# Patient Record
Sex: Female | Born: 1985 | Race: White | Hispanic: No | Marital: Single | Smoking: Current every day smoker
Health system: Southern US, Community
[De-identification: ages and names within clinical notes are randomized; demographics above are authoritative.]

## PROBLEM LIST (undated history)

## (undated) DIAGNOSIS — Z789 Other specified health status: Secondary | ICD-10-CM

## (undated) DIAGNOSIS — R51 Headache: Secondary | ICD-10-CM

## (undated) HISTORY — PX: NO PAST SURGERIES: SHX2092

## (undated) HISTORY — PX: WISDOM TOOTH EXTRACTION: SHX21

## (undated) HISTORY — DX: Headache: R51

---
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
## 2008-01-26 ENCOUNTER — Emergency Department (HOSPITAL_COMMUNITY): Admission: EM | Admit: 2008-01-26 | Discharge: 2008-01-26 | Payer: Self-pay | Admitting: Specialist

## 2009-12-25 ENCOUNTER — Emergency Department (HOSPITAL_COMMUNITY): Admission: EM | Admit: 2009-12-25 | Discharge: 2009-12-26 | Payer: Self-pay | Admitting: Emergency Medicine

## 2010-07-17 ENCOUNTER — Emergency Department (HOSPITAL_COMMUNITY)
Admission: EM | Admit: 2010-07-17 | Discharge: 2010-07-17 | Payer: Self-pay | Source: Home / Self Care | Admitting: Emergency Medicine

## 2010-07-19 ENCOUNTER — Emergency Department (HOSPITAL_COMMUNITY)
Admission: EM | Admit: 2010-07-19 | Discharge: 2010-07-19 | Payer: Self-pay | Source: Home / Self Care | Admitting: Family Medicine

## 2010-12-20 NOTE — Procedures (Signed)
CHIEF COMPLAINT:  [age]-year-old female with episode of loss of  consciousness with generalized shaking that lasted for a few seconds to a  minute. The patient was well in the aftermath. EEG was ordered for possible  seizure. Study is being done to look for the presence of seizures.   PROCEDURE:  The tracing is carried out on a 32-channel digital Cadwell  recorder reformatted into 16-channel montages with one devoted to EKG. The  patient was awake and drowsy during the recording. The International 10/20  system lead placement was used.   DESCRIPTION OF FINDINGS:  Dominant frequency is a 9 Hz 15 microvolt activity  that is well regulated and attenuates very little with eye opening.   Background activity is a mixture of theta and occasional delta range  components that are  more generalized than centrally predominant and  posterior.   Frontally predominant under 10 microvolt beta range activity was seen.   Intermittent photic stimulation induced a sustained driving response at 5 Hz  and gave the suggestion of a sharply contoured slow wave at 7 Hz. This was  repeated again and showed the same very active well-defined driving response  at 5 Hz but did not show significant change at 7 Hz. For some reasons that  are unclear to me the technologist did not go farther. Occasional sharply  contoured slow waves were seen throughout the record, however, these were  not sufficient in number nor definitive in there feel to be epileptogenic  from an electrographic viewpoint.   There was no focal slowing. EKG showed a regular sinus rhythm with  ventricular response of 102 beats per minute.   IMPRESSION:  Abnormal EEG on the basis of mild diffuse background slowing.  The degree of theta and delta range activity is excessive and they relate to  a drowsy patient or perhaps a postictal state. There was no sign of cardiac  arrhythmia nor of seizure activity in this record.      DGL:OVFI  D:   09/09/2004 18:30:41  T:  09/09/2004 19:39:37  Job #:  [id number]   cc:   Nilda Simmer, M.D.  [address]  [place] [address]  Fax: [phone]
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
## 2011-05-28 ENCOUNTER — Inpatient Hospital Stay (INDEPENDENT_AMBULATORY_CARE_PROVIDER_SITE_OTHER)
Admission: RE | Admit: 2011-05-28 | Discharge: 2011-05-28 | Disposition: A | Discharge status: Home / Self Care | Payer: 59 | Source: Ambulatory Visit | Attending: Emergency Medicine | Admitting: Emergency Medicine

## 2011-05-28 DIAGNOSIS — J019 Acute sinusitis, unspecified: Secondary | ICD-10-CM

## 2011-05-28 DIAGNOSIS — J40 Bronchitis, not specified as acute or chronic: Secondary | ICD-10-CM

## 2011-07-29 ENCOUNTER — Encounter: Payer: Self-pay | Admitting: Emergency Medicine

## 2011-07-29 ENCOUNTER — Observation Stay (HOSPITAL_COMMUNITY)
Admission: EM | Admit: 2011-07-29 | Discharge: 2011-07-31 | Disposition: A | Discharge status: Home / Self Care | Payer: 59 | Attending: Internal Medicine | Admitting: Internal Medicine

## 2011-07-29 DIAGNOSIS — R824 Acetonuria: Secondary | ICD-10-CM | POA: Insufficient documentation

## 2011-07-29 DIAGNOSIS — R110 Nausea: Secondary | ICD-10-CM

## 2011-07-29 DIAGNOSIS — R1115 Cyclical vomiting syndrome unrelated to migraine: Principal | ICD-10-CM | POA: Insufficient documentation

## 2011-07-29 DIAGNOSIS — R1110 Vomiting, unspecified: Secondary | ICD-10-CM | POA: Diagnosis present

## 2011-07-29 DIAGNOSIS — K5900 Constipation, unspecified: Secondary | ICD-10-CM | POA: Insufficient documentation

## 2011-07-29 DIAGNOSIS — E860 Dehydration: Secondary | ICD-10-CM | POA: Insufficient documentation

## 2011-07-29 HISTORY — DX: Other specified health status: Z78.9

## 2011-07-29 NOTE — ED Notes (Signed)
Pt presented to the ER with c/o nausea and vomiting, states that s/s started today, pt attempted to eat and there after experinced nausea, went to sleep, had something to eat again, vomited there after. Pt reports multiple episodes of emesis, more then 10.

## 2011-07-30 ENCOUNTER — Encounter (HOSPITAL_COMMUNITY): Payer: Self-pay

## 2011-07-30 DIAGNOSIS — R1110 Vomiting, unspecified: Secondary | ICD-10-CM | POA: Diagnosis present

## 2011-07-30 DIAGNOSIS — E860 Dehydration: Secondary | ICD-10-CM | POA: Diagnosis present

## 2011-07-30 LAB — BASIC METABOLIC PANEL
Glucose, Bld: 140 mg/dL — ABNORMAL HIGH (ref 70–99)
Potassium: 3.8 mEq/L (ref 3.5–5.1)
Sodium: 137 mEq/L (ref 135–145)

## 2011-07-30 LAB — CBC
HCT: 39.5 % (ref 36.0–46.0)
Hemoglobin: 13.8 g/dL (ref 12.0–15.0)
RBC: 4.53 MIL/uL (ref 3.87–5.11)
WBC: 10.9 10*3/uL — ABNORMAL HIGH (ref 4.0–10.5)

## 2011-07-30 LAB — DIFFERENTIAL
Eosinophils Absolute: 0 10*3/uL (ref 0.0–0.7)
Eosinophils Relative: 0 % (ref 0–5)
Lymphs Abs: 0.9 10*3/uL (ref 0.7–4.0)
Monocytes Absolute: 0.2 10*3/uL (ref 0.1–1.0)

## 2011-07-30 LAB — URINALYSIS, ROUTINE W REFLEX MICROSCOPIC
Glucose, UA: NEGATIVE mg/dL
Protein, ur: 30 mg/dL — AB
Urobilinogen, UA: 0.2 mg/dL (ref 0.0–1.0)
pH: 8 (ref 5.0–8.0)

## 2011-07-30 LAB — PREGNANCY, URINE: Preg Test, Ur: NEGATIVE

## 2011-07-30 MED ORDER — ONDANSETRON HCL 4 MG/2ML IJ SOLN: 4.0000 mg | Freq: Once | INTRAMUSCULAR | Status: DC

## 2011-07-30 MED ORDER — PROMETHAZINE HCL 25 MG PO TABS: 25.0000 mg | ORAL_TABLET | Freq: Four times a day (QID) | ORAL | Status: DC | PRN

## 2011-07-30 MED ORDER — ONDANSETRON HCL 4 MG/2ML IJ SOLN
4.0000 mg | Freq: Once | INTRAMUSCULAR | Status: AC
  Administered 2011-07-30: 4 mg via INTRAVENOUS

## 2011-07-30 MED ORDER — SODIUM CHLORIDE 0.9 % IV BOLUS (SEPSIS)
1000.0000 mL | Freq: Once | INTRAVENOUS | Status: AC
  Administered 2011-07-30: 1000 mL via INTRAVENOUS

## 2011-07-30 MED ORDER — METOCLOPRAMIDE HCL 5 MG/ML IJ SOLN
10.0000 mg | Freq: Once | INTRAMUSCULAR | Status: AC
  Administered 2011-07-30: 10 mg via INTRAVENOUS
  Filled 2011-07-30: qty 2

## 2011-07-30 MED ORDER — ONDANSETRON HCL 4 MG/2ML IJ SOLN
4.0000 mg | Freq: Once | INTRAMUSCULAR | Status: AC
Start: 2011-07-30 — End: 2011-07-30
  Administered 2011-07-30: 4 mg via INTRAVENOUS
  Filled 2011-07-30: qty 2

## 2011-07-30 MED ORDER — SODIUM CHLORIDE 0.9 % IV SOLN
Freq: Once | INTRAVENOUS | Status: AC
  Administered 2011-07-30: 02:00:00 via INTRAVENOUS

## 2011-07-30 MED ORDER — KETOROLAC TROMETHAMINE 30 MG/ML IJ SOLN
30.0000 mg | Freq: Once | INTRAMUSCULAR | Status: AC
  Administered 2011-07-30: 30 mg via INTRAVENOUS
  Filled 2011-07-30: qty 1

## 2011-07-30 MED ORDER — DROPERIDOL 2.5 MG/ML IJ SOLN
1.2500 mg | Freq: Once | INTRAMUSCULAR | Status: AC
  Administered 2011-07-30: 1.25 mg via INTRAVENOUS
  Filled 2011-07-30: qty 0.5

## 2011-07-30 MED ORDER — PROMETHAZINE HCL 25 MG/ML IJ SOLN
12.5000 mg | Freq: Once | INTRAMUSCULAR | Status: AC
  Administered 2011-07-30: 12.5 mg via INTRAVENOUS
  Filled 2011-07-30: qty 1

## 2011-07-30 MED ORDER — METOCLOPRAMIDE HCL 5 MG/ML IJ SOLN: 10.0000 mg | Freq: Once | INTRAMUSCULAR | Status: DC

## 2011-07-30 MED ORDER — ACETAMINOPHEN 650 MG RE SUPP
650.0000 mg | Freq: Once | RECTAL | Status: AC
  Administered 2011-07-30: 650 mg via RECTAL
  Filled 2011-07-30: qty 1

## 2011-07-30 MED ORDER — DIPHENHYDRAMINE HCL 50 MG/ML IJ SOLN
25.0000 mg | Freq: Once | INTRAMUSCULAR | Status: AC
  Administered 2011-07-30: 25 mg via INTRAVENOUS

## 2011-07-30 MED ORDER — ONDANSETRON HCL 4 MG/2ML IJ SOLN
4.0000 mg | Freq: Once | INTRAMUSCULAR | Status: DC
  Filled 2011-07-30: qty 2

## 2011-07-30 MED ORDER — ONDANSETRON HCL 4 MG/2ML IJ SOLN
INTRAMUSCULAR | Status: AC
  Administered 2011-07-31: 01:00:00
  Filled 2011-07-30: qty 2

## 2011-07-30 MED ORDER — SODIUM CHLORIDE 0.9 % IV SOLN
Freq: Once | INTRAVENOUS | Status: AC
  Administered 2011-07-30: 16:00:00 via INTRAVENOUS

## 2011-07-30 MED ORDER — KCL IN DEXTROSE-NACL 20-5-0.45 MEQ/L-%-% IV SOLN
INTRAVENOUS | Status: DC
  Administered 2011-07-30 – 2011-07-31 (×2): via INTRAVENOUS
  Filled 2011-07-30 (×6): qty 1000

## 2011-07-30 NOTE — ED Notes (Signed)
Attempted report Rn will return call

## 2011-07-30 NOTE — ED Provider Notes (Addendum)
Assumed care from Dr. Preston Fleeting.  Reviewed H&P and reevaluated the patient.  I agree with his assessment.  She continues to have nausea.  I will give her Phenergan now and another liter of fluids. The patient has not had any emesis in the emergency department.  I will write her a prescription for an antiemetic and release her to home.  Nicholes Stairs, MD 07/30/11 1106  12:27 PM  The rn told me the pt vomited once again.  Will give inapsine.  Nicholes Stairs, MD 07/30/11 1227  2:40 PM Still nauseated even after droperidol Will cancel discharge and admit  2:58 PM Spoke with triad md.  He will come admit for refractory n/v with ketonuria  Nicholes Stairs, MD 07/30/11 1459

## 2011-07-30 NOTE — ED Notes (Signed)
Patient asleep assessment deferred.

## 2011-07-30 NOTE — H&P (Signed)
PCP:   No primary provider on file.   Chief Complaint:  irretractable vomiting  HPI: This is a 25 year old healthy female presents to the emergency department with approximately 24 hours of irretractable vomiting and dehydration that began after eating ay a [place]. Vomiting made worse with food. Nothing has improved the vomiting. She denies diarrhea, abdominal pain. She does have sweats and chills. No sick contacts. Her friends who is at [employer] with her have similar symptoms, although eating similar foods. Last bowel movement yesterday which was normal per the patient.  Review of Systems:  The patient denies anorexia, weight loss,, vision loss, decreased hearing, hoarseness, chest pain, syncope, dyspnea on exertion, peripheral edema, balance deficits, hemoptysis, abdominal pain, melena, hematochezia, severe indigestion/heartburn, hematuria, incontinence, genital sores, muscle weakness, suspicious skin lesions, transient blindness, difficulty walking, depression, unusual weight change, abnormal bleeding, enlarged lymph nodes, angioedema, and breast masses.  Past Medical History: Past Medical History  Diagnosis Date  . No pertinent past medical history    Past Surgical History  Procedure Date  . No past surgeries     Medications: Prior to Admission medications   Medication Sig Start Date End Date Taking? Authorizing Provider  promethazine (PHENERGAN) 25 MG tablet Take 1 tablet (25 mg total) by mouth every 6 (six) hours as needed for nausea. 07/30/11 08/06/11  Nicholes Stairs, MD    Allergies:  No Known Allergies  Social History:  reports that she has never smoked. She has never used smokeless tobacco. She reports that she does not drink alcohol or use illicit drugs.  Family History: History reviewed. No pertinent family history.  Physical Exam: Filed Vitals:   07/30/11 0501 07/30/11 0743 07/30/11 1113 07/30/11 1316  BP: 116/82 119/66 114/77   Pulse: 102  95 90 78  Temp: 98.1 F (36.7 C) 99.4 F (37.4 C) 98 F (36.7 C)   TempSrc: Oral Oral Oral   Resp: 16 16 18 16   SpO2: 100% 100% 100% 100%   General appearance: alert, cooperative and no distress Head: Normocephalic, without obvious abnormality, atraumatic Eyes: conjunctivae/corneas clear. PERRL, EOM's intact. Fundi benign. Ears: normal TM's and external ear canals both ears Throat: lips, mucosa, and tongue normal; teeth and gums normal Neck: no adenopathy, no carotid bruit, no JVD, supple, symmetrical, trachea midline and thyroid not enlarged, symmetric, no tenderness/mass/nodules Resp: clear to auscultation bilaterally Cardio: regular rate and rhythm, S1, S2 normal, no murmur, click, rub or gallop GI: Mild left lower quadrant tenderness. Extremities: extremities normal, atraumatic, no cyanosis or edema Pulses: 2+ and symmetric Skin: Skin color, texture, turgor normal. No rashes or lesions Lymph nodes: Cervical, supraclavicular, and axillary nodes normal.   Labs on Admission:   [facility] 07/30/11 0117  NA 137  K 3.8  CL 102  CO2 24  GLUCOSE 140*  BUN 11  CREATININE 0.61  CALCIUM 9.7  MG --  PHOS --   Basename 07/30/11 0117  WBC 10.9*  NEUTROABS 9.7*  HGB 13.8  HCT 39.5  MCV 87.2  PLT 216   Urinalysis showed constipated urine with greater than 80 ketones with negative nitrites and leukocyte esterase.  Assessment/Plan #1 irretractable vomiting likely due to viral gastroenteritis. #2 dehydration secondary to #1 #3 ketonuria  Admit for observation. Will start D5 half normal saline with potassium of IV fluids to 2 dehydration and ketonuria. Continue antiemetics. Encourage sips of fluids. SCDs for DVT prophylaxis. Patient is a full code.  Dana Debo JEHIEL 07/30/2011, 3:57 PM

## 2011-07-30 NOTE — ED Notes (Signed)
Report called to the floor patient tfrd to floor via bed

## 2011-07-30 NOTE — ED Provider Notes (Addendum)
History     CSN: [id number]  Arrival date & time 07/29/11  2330   First MD Initiated Contact with Patient 07/30/11 0056      Chief Complaint  Patient presents with  . Nausea  . Emesis    (Consider location/radiation/quality/duration/timing/severity/associated sxs/prior treatment) The history is provided by the patient.   25 year old female had onset at about 2 PM of nausea and vomiting. There's been some mild, crampy abdominal pain associated with this. She denies fever but states she has had chills and sweats. She denies diarrhea. She denies any urinary symptoms. Symptoms are severe and she's not been able to hold anything down since she started getting sick. She denies sick contacts and no one else at home has gotten ill. History reviewed. No pertinent past medical history.  History reviewed. No pertinent past surgical history.  History reviewed. No pertinent family history.  History  Substance Use Topics  . Smoking status: Never Smoker   . Smokeless tobacco: Not on file  . Alcohol Use: No    OB History    Grav Para Term Preterm Abortions TAB SAB Ect Mult Living                  Review of Systems  All other systems reviewed and are negative.    Allergies  Review of patient's allergies indicates no known allergies.  Home Medications  No current outpatient prescriptions on file.  BP 100/67  Pulse 87  Temp(Src) 97.6 F (36.4 C) (Oral)  Resp 16  SpO2 100%  LMP 07/15/2011  Physical Exam  Nursing note and vitals reviewed.  25-year-old female who is uncomfortable. Vital signs are normal. Oxygen saturation is 100% which is normal. Head is normocephalic and atraumatic. PERRLA, EOMI. There is no scleral icterus. Mucous members are moist. Neck is supple without adenopathy lungs are clear without rales, wheezes, rhonchi. Back is nontender. Heart has regular rate and rhythm without murmur. Abdomen is soft, flat, nontender without masses or hepatosplenomegaly.  Peristalsis is diminished. Extremities have no cyanosis or edema skin is warm and moist without rash. Neurologic: Mental status is normal, cranial nerves are intact, there are no focal motor or sensory deficits. Psychiatric: No abnormalities of mood or affect. ED Course  Procedures (including critical care time)   Labs Reviewed  CBC  DIFFERENTIAL  BASIC METABOLIC PANEL  URINALYSIS, ROUTINE W REFLEX MICROSCOPIC  POCT PREGNANCY, URINE   No results found. Results for orders placed during the hospital encounter of 07/29/11  CBC      Component Value Range   WBC 10.9 (*) 4.0 - 10.5 (K/uL)   RBC 4.53  3.87 - 5.11 (MIL/uL)   Hemoglobin 13.8  12.0 - 15.0 (g/dL)   HCT 43.3  29.5 - 18.8 (%)   MCV 87.2  78.0 - 100.0 (fL)   MCH 30.5  26.0 - 34.0 (pg)   MCHC 34.9  30.0 - 36.0 (g/dL)   RDW 41.6  60.6 - 30.1 (%)   Platelets 216  150 - 400 (K/uL)  DIFFERENTIAL      Component Value Range   Neutrophils Relative 89 (*) 43 - 77 (%)   Neutro Abs 9.7 (*) 1.7 - 7.7 (K/uL)   Lymphocytes Relative 9 (*) 12 - 46 (%)   Lymphs Abs 0.9  0.7 - 4.0 (K/uL)   Monocytes Relative 2 (*) 3 - 12 (%)   Monocytes Absolute 0.2  0.1 - 1.0 (K/uL)   Eosinophils Relative 0  0 - 5 (%)  Eosinophils Absolute 0.0  0.0 - 0.7 (K/uL)   Basophils Relative 0  0 - 1 (%)   Basophils Absolute 0.0  0.0 - 0.1 (K/uL)  BASIC METABOLIC PANEL      Component Value Range   Sodium 137  135 - 145 (mEq/L)   Potassium 3.8  3.5 - 5.1 (mEq/L)   Chloride 102  96 - 112 (mEq/L)   CO2 24  19 - 32 (mEq/L)   Glucose, Bld 140 (*) 70 - 99 (mg/dL)   BUN 11  6 - 23 (mg/dL)   Creatinine, Ser 1.61  0.50 - 1.10 (mg/dL)   Calcium 9.7  8.4 - 09.6 (mg/dL)   GFR calc non Af Amer >90  >90 (mL/min)   GFR calc Af Amer >90  >90 (mL/min)  URINALYSIS, ROUTINE W REFLEX MICROSCOPIC      Component Value Range   Color, Urine YELLOW  YELLOW    APPearance TURBID (*) CLEAR    Specific Gravity, Urine 1.026  1.005 - 1.030    pH 8.0  5.0 - 8.0    Glucose, UA  NEGATIVE  NEGATIVE (mg/dL)   Hgb urine dipstick NEGATIVE  NEGATIVE    Bilirubin Urine NEGATIVE  NEGATIVE    Ketones, ur >80 (*) NEGATIVE (mg/dL)   Protein, ur 30 (*) NEGATIVE (mg/dL)   Urobilinogen, UA 0.2  0.0 - 1.0 (mg/dL)   Nitrite NEGATIVE  NEGATIVE    Leukocytes, UA SMALL (*) NEGATIVE   PREGNANCY, URINE      Component Value Range   Preg Test, Ur NEGATIVE    URINE MICROSCOPIC-ADD ON      Component Value Range   Squamous Epithelial / LPF FEW (*) RARE    WBC, UA 3-6  <3 (WBC/hpf)   Urine-Other AMORPHOUS URATES/PHOSPHATES     No results found.  Results for orders placed during the hospital encounter of 07/29/11  CBC      Component Value Range   WBC 10.9 (*) 4.0 - 10.5 (K/uL)   RBC 4.53  3.87 - 5.11 (MIL/uL)   Hemoglobin 13.8  12.0 - 15.0 (g/dL)   HCT 04.5  40.9 - 81.1 (%)   MCV 87.2  78.0 - 100.0 (fL)   MCH 30.5  26.0 - 34.0 (pg)   MCHC 34.9  30.0 - 36.0 (g/dL)   RDW 91.4  78.2 - 95.6 (%)   Platelets 216  150 - 400 (K/uL)  DIFFERENTIAL      Component Value Range   Neutrophils Relative 89 (*) 43 - 77 (%)   Neutro Abs 9.7 (*) 1.7 - 7.7 (K/uL)   Lymphocytes Relative 9 (*) 12 - 46 (%)   Lymphs Abs 0.9  0.7 - 4.0 (K/uL)   Monocytes Relative 2 (*) 3 - 12 (%)   Monocytes Absolute 0.2  0.1 - 1.0 (K/uL)   Eosinophils Relative 0  0 - 5 (%)   Eosinophils Absolute 0.0  0.0 - 0.7 (K/uL)   Basophils Relative 0  0 - 1 (%)   Basophils Absolute 0.0  0.0 - 0.1 (K/uL)  BASIC METABOLIC PANEL      Component Value Range   Sodium 137  135 - 145 (mEq/L)   Potassium 3.8  3.5 - 5.1 (mEq/L)   Chloride 102  96 - 112 (mEq/L)   CO2 24  19 - 32 (mEq/L)   Glucose, Bld 140 (*) 70 - 99 (mg/dL)   BUN 11  6 - 23 (mg/dL)   Creatinine, Ser 2.13  0.50 - 1.10 (  mg/dL)   Calcium 9.7  8.4 - 16.1 (mg/dL)   GFR calc non Af Amer >90  >90 (mL/min)   GFR calc Af Amer >90  >90 (mL/min)  URINALYSIS, ROUTINE W REFLEX MICROSCOPIC      Component Value Range   Color, Urine YELLOW  YELLOW    APPearance TURBID  (*) CLEAR    Specific Gravity, Urine 1.026  1.005 - 1.030    pH 8.0  5.0 - 8.0    Glucose, UA NEGATIVE  NEGATIVE (mg/dL)   Hgb urine dipstick NEGATIVE  NEGATIVE    Bilirubin Urine NEGATIVE  NEGATIVE    Ketones, ur >80 (*) NEGATIVE (mg/dL)   Protein, ur 30 (*) NEGATIVE (mg/dL)   Urobilinogen, UA 0.2  0.0 - 1.0 (mg/dL)   Nitrite NEGATIVE  NEGATIVE    Leukocytes, UA SMALL (*) NEGATIVE   PREGNANCY, URINE      Component Value Range   Preg Test, Ur NEGATIVE    URINE MICROSCOPIC-ADD ON      Component Value Range   Squamous Epithelial / LPF FEW (*) RARE    WBC, UA 3-6  <3 (WBC/hpf)   Urine-Other AMORPHOUS URATES/PHOSPHATES    BASIC METABOLIC PANEL      Component Value Range   Sodium 136  135 - 145 (mEq/L)   Potassium 3.8  3.5 - 5.1 (mEq/L)   Chloride 106  96 - 112 (mEq/L)   CO2 22  19 - 32 (mEq/L)   Glucose, Bld 136 (*) 70 - 99 (mg/dL)   BUN 6  6 - 23 (mg/dL)   Creatinine, Ser 0.96  0.50 - 1.10 (mg/dL)   Calcium 8.6  8.4 - 04.5 (mg/dL)   GFR calc non Af Amer >90  >90 (mL/min)   GFR calc Af Amer >90  >90 (mL/min)   No results found.   No diagnosis found.  She was given IV fluids and IV Zofran, but states that she is not feeling any better. Urine is significant only for ketonuria. She was given additional fluid and she will get a trial of Reglan to see if that gives her better control of nausea.  She is endorsed to Dr. Loma Sender to evaluate her response to Reglan and IV fluid. MDM  Nausea and vomiting-most likely viral gastritis.        Dione Booze, MD 07/31/11 0800  Dione Booze, MD 07/31/11 [phone]

## 2011-07-30 NOTE — ED Notes (Signed)
patient now awake and assessment done

## 2011-07-31 ENCOUNTER — Encounter (HOSPITAL_COMMUNITY): Payer: Self-pay | Admitting: *Deleted

## 2011-07-31 DIAGNOSIS — K5900 Constipation, unspecified: Secondary | ICD-10-CM | POA: Diagnosis present

## 2011-07-31 LAB — BASIC METABOLIC PANEL
BUN: 6 mg/dL (ref 6–23)
CO2: 22 mEq/L (ref 19–32)
Chloride: 106 mEq/L (ref 96–112)
GFR calc Af Amer: >90 mL/min (ref >90)
Potassium: 3.8 mEq/L (ref 3.5–5.1)

## 2011-07-31 MED ORDER — ZOLPIDEM TARTRATE 5 MG PO TABS
5.0000 mg | ORAL_TABLET | Freq: Every evening | ORAL | Status: DC | PRN
  Administered 2011-07-31: 5 mg via ORAL
  Filled 2011-07-31: qty 1

## 2011-07-31 MED ORDER — PROMETHAZINE HCL 25 MG PO TABS: 12.5000 mg | ORAL_TABLET | Freq: Four times a day (QID) | ORAL | Status: AC | PRN

## 2011-07-31 MED ORDER — ONDANSETRON 4 MG PO TBDP
4.0000 mg | ORAL_TABLET | ORAL | Status: DC | PRN
  Filled 2011-07-31: qty 1

## 2011-07-31 MED ORDER — INFLUENZA VIRUS VACC SPLIT PF IM SUSP
0.5000 mL | Freq: Once | INTRAMUSCULAR | Status: AC
  Administered 2011-07-31: 0.5 mL via INTRAMUSCULAR
  Filled 2011-07-31: qty 0.5

## 2011-07-31 MED ORDER — PROMETHAZINE HCL 25 MG PO TABS: 25.0000 mg | ORAL_TABLET | Freq: Four times a day (QID) | ORAL | Status: DC | PRN

## 2011-07-31 MED ORDER — BISACODYL 10 MG RE SUPP: 10.0000 mg | Freq: Every day | RECTAL | Status: AC | PRN

## 2011-07-31 MED ORDER — PROMETHAZINE HCL 25 MG/ML IJ SOLN: 25.0000 mg | Freq: Four times a day (QID) | INTRAMUSCULAR | Status: DC | PRN

## 2011-07-31 MED ORDER — BISACODYL 10 MG RE SUPP
10.0000 mg | Freq: Every day | RECTAL | Status: DC | PRN
  Administered 2011-07-31: 10 mg via RECTAL
  Filled 2011-07-31: qty 1

## 2011-07-31 MED ORDER — SENNA-DOCUSATE SODIUM 8.6-50 MG PO TABS: 1.0000 | ORAL_TABLET | Freq: Two times a day (BID) | ORAL | Status: DC | PRN

## 2011-07-31 MED ORDER — ONDANSETRON HCL 4 MG/2ML IJ SOLN
4.0000 mg | INTRAMUSCULAR | Status: DC | PRN
  Administered 2011-07-31 (×2): 4 mg via INTRAVENOUS
  Filled 2011-07-31 (×2): qty 2

## 2011-07-31 NOTE — Progress Notes (Signed)
Pt discharged home with mother in stable condition. Discharge instructions and scripts given. Pt verbalized understanding. 

## 2011-07-31 NOTE — Discharge Summary (Signed)
Discharge Summary  [name] MR#: [id number]  DOB:1986-04-14  Date of Admission: 07/29/2011 Date of Discharge: 07/31/2011  Patient's PCP: No primary provider on file.  Attending Physician:Kallum Jorgensen A  Consults: None  Discharge Diagnoses:  Vomiting likely due to viral gastroenteritis versus possible food poisoning.  Dehydration, moderate  Constipation  Brief Admitting History and Physical 25 year old Caucasian female with no significant past medical history presented on 07/30/2011 with intractable nausea and vomiting after ED and at [employer].  Discharge Medications Current Discharge Medication List    START taking these medications   Details  bisacodyl (DULCOLAX) 10 MG suppository Place 1 suppository (10 mg total) rectally daily as needed for constipation. Qty: 5 suppository, Refills: 0    promethazine (PHENERGAN) 25 MG tablet Take 0.5 tablets (12.5 mg total) by mouth every 6 (six) hours as needed for nausea. Qty: 15 tablet, Refills: 0    sennosides-docusate sodium (SENOKOT-S) 8.6-50 MG tablet Take 1 tablet by mouth 2 (two) times daily as needed for constipation. Hold for diarrhea. Qty: 30 tablet, Refills: 0        Hospital Course: 1. Irretractable vomiting likely due to viral gastroenteritis versus possible food poisoning.  Improved with conservative management with IV hydration and antiemetics.  Patient was started on clear liquid diet and diet was advanced as tolerated to general diet.  Prior to discharge patient was eating solid food and was able to keep it down.  2. Dehydration secondary to #1, resolved with IV hydration.   3. Ketonuria.  Likely due to dehydration.  4. Constipation. Started the patient on stool softeners and suppository, which will be continued at discharge.  Day of Discharge BP 117/64  Pulse 58  Temp(Src) 98 F (36.7 C) (Oral)  Resp 18  Ht 5\' 1"  (1.549 m)  Wt 47.401 kg (104 lb 8 oz)  BMI 19.75 kg/m2  SpO2 100%   LMP 07/15/2011  Results for orders placed during the hospital encounter of 07/29/11 (from the past 48 hour(s))  CBC     Status: Abnormal   Collection Time   07/30/11  1:17 AM      Component Value Range Comment   WBC 10.9 (*) 4.0 - 10.5 (K/uL)    RBC 4.53  3.87 - 5.11 (MIL/uL)    Hemoglobin 13.8  12.0 - 15.0 (g/dL)    HCT 40.9  81.1 - 91.4 (%)    MCV 87.2  78.0 - 100.0 (fL)    MCH 30.5  26.0 - 34.0 (pg)    MCHC 34.9  30.0 - 36.0 (g/dL)    RDW 78.2  95.6 - 21.3 (%)    Platelets 216  150 - 400 (K/uL)   DIFFERENTIAL     Status: Abnormal   Collection Time   07/30/11  1:17 AM      Component Value Range Comment   Neutrophils Relative 89 (*) 43 - 77 (%)    Neutro Abs 9.7 (*) 1.7 - 7.7 (K/uL)    Lymphocytes Relative 9 (*) 12 - 46 (%)    Lymphs Abs 0.9  0.7 - 4.0 (K/uL)    Monocytes Relative 2 (*) 3 - 12 (%)    Monocytes Absolute 0.2  0.1 - 1.0 (K/uL)    Eosinophils Relative 0  0 - 5 (%)    Eosinophils Absolute 0.0  0.0 - 0.7 (K/uL)    Basophils Relative 0  0 - 1 (%)    Basophils Absolute 0.0  0.0 - 0.1 (K/uL)   BASIC METABOLIC PANEL  Status: Abnormal   Collection Time   07/30/11  1:17 AM      Component Value Range Comment   Sodium 137  135 - 145 (mEq/L)    Potassium 3.8  3.5 - 5.1 (mEq/L)    Chloride 102  96 - 112 (mEq/L)    CO2 24  19 - 32 (mEq/L)    Glucose, Bld 140 (*) 70 - 99 (mg/dL)    BUN 11  6 - 23 (mg/dL)    Creatinine, Ser 2.59  0.50 - 1.10 (mg/dL)    Calcium 9.7  8.4 - 10.5 (mg/dL)    GFR calc non Af Amer >90  >90 (mL/min)    GFR calc Af Amer >90  >90 (mL/min)   URINALYSIS, ROUTINE W REFLEX MICROSCOPIC     Status: Abnormal   Collection Time   07/30/11  3:14 AM      Component Value Range Comment   Color, Urine YELLOW  YELLOW     APPearance TURBID (*) CLEAR     Specific Gravity, Urine 1.026  1.005 - 1.030     pH 8.0  5.0 - 8.0     Glucose, UA NEGATIVE  NEGATIVE (mg/dL)    Hgb urine dipstick NEGATIVE  NEGATIVE     Bilirubin Urine NEGATIVE  NEGATIVE     Ketones,  ur >80 (*) NEGATIVE (mg/dL)    Protein, ur 30 (*) NEGATIVE (mg/dL)    Urobilinogen, UA 0.2  0.0 - 1.0 (mg/dL)    Nitrite NEGATIVE  NEGATIVE     Leukocytes, UA SMALL (*) NEGATIVE    URINE MICROSCOPIC-ADD ON     Status: Abnormal   Collection Time   07/30/11  3:14 AM      Component Value Range Comment   Squamous Epithelial / LPF FEW (*) RARE     WBC, UA 3-6  <3 (WBC/hpf)    Urine-Other AMORPHOUS URATES/PHOSPHATES   MUCOUS PRESENT  PREGNANCY, URINE     Status: Normal   Collection Time   07/30/11  3:46 AM      Component Value Range Comment   Preg Test, Ur NEGATIVE     BASIC METABOLIC PANEL     Status: Abnormal   Collection Time   07/31/11  3:33 AM      Component Value Range Comment   Sodium 136  135 - 145 (mEq/L)    Potassium 3.8  3.5 - 5.1 (mEq/L)    Chloride 106  96 - 112 (mEq/L)    CO2 22  19 - 32 (mEq/L)    Glucose, Bld 136 (*) 70 - 99 (mg/dL)    BUN 6  6 - 23 (mg/dL)    Creatinine, Ser 5.63  0.50 - 1.10 (mg/dL)    Calcium 8.6  8.4 - 10.5 (mg/dL)    GFR calc non Af Amer >90  >90 (mL/min)    GFR calc Af Amer >90  >90 (mL/min)     No results found.   Disposition: Home  Diet: Resume as tolerated  Activity: Resume as tolerated   Follow-up Appts: Discharge Orders    Future Orders Please Complete By Expires   Diet general      Increase activity slowly         TESTS THAT NEED FOLLOW-UP None  Time spent on discharge, talking to the patient, and coordinating care: 25 mins.   Signed: Cristal Ford, MD 07/31/2011, 2:19 PM

## 2011-07-31 NOTE — Progress Notes (Signed)
Subjective: Feeling better today.  Nausea and vomiting controlled.  Able to tolerate a diet today.  Objective: Vital signs in last 24 hours: Filed Vitals:   07/30/11 2246 07/30/11 2300 07/31/11 0646 07/31/11 1312  BP:  108/74 96/62 117/64  Pulse:  84 75 58  Temp:  98.3 F (36.8 C) 97.7 F (36.5 C) 98 F (36.7 C)  TempSrc:  Oral Oral Oral  Resp:  18 16 18   Height: 5\' 1"  (1.549 m) 5\' 1"  (1.549 m)    Weight: 45.496 kg (100 lb 4.8 oz) 47.401 kg (104 lb 8 oz)    SpO2:  95% 98% 100%   Weight change:   Intake/Output Summary (Last 24 hours) at 07/31/11 1412 Last data filed at 07/31/11 1300  Gross per 24 hour  Intake   4120 ml  Output    450 ml  Net   3670 ml    Physical Exam: General: Awake, Oriented, No acute distress. HEENT: EOMI. Neck: Supple CV: S1 and S2 Lungs: Clear to ascultation bilaterally Abdomen: Soft, Nontender, Nondistended, +bowel sounds. Ext: Good pulses. Trace edema.  Lab Results:  [facility] 07/31/11 0333 07/30/11 0117  NA 136 137  K 3.8 3.8  CL 106 102  CO2 22 24  GLUCOSE 136* 140*  BUN 6 11  CREATININE 0.53 0.61  CALCIUM 8.6 9.7  MG -- --  PHOS -- --   No results found for this basename: AST:2,ALT:2,ALKPHOS:2,BILITOT:2,PROT:2,ALBUMIN:2 in the last 72 hours No results found for this basename: LIPASE:2,AMYLASE:2 in the last 72 hours  Basename 07/30/11 0117  WBC 10.9*  NEUTROABS 9.7*  HGB 13.8  HCT 39.5  MCV 87.2  PLT 216   No results found for this basename: CKTOTAL:3,CKMB:3,CKMBINDEX:3,TROPONINI:3 in the last 72 hours No components found with this basename: POCBNP:3 No results found for this basename: DDIMER:2 in the last 72 hours No results found for this basename: HGBA1C:2 in the last 72 hours No results found for this basename: CHOL:2,HDL:2,LDLCALC:2,TRIG:2,CHOLHDL:2,LDLDIRECT:2 in the last 72 hours No results found for this basename: TSH,T4TOTAL,FREET3,T3FREE,THYROIDAB in the last 72 hours No results found for this basename:  VITAMINB12:2,FOLATE:2,FERRITIN:2,TIBC:2,IRON:2,RETICCTPCT:2 in the last 72 hours  Micro Results: No results found for this or any previous visit (from the past 240 hour(s)).  Studies/Results: No results found.  Medications: I have reviewed the patient's current medications. Scheduled Meds:   . sodium chloride   Intravenous Once  . acetaminophen  650 mg Rectal Once  . influenza  inactive virus vaccine  0.5 mL Intramuscular Once  . ketorolac  30 mg Intravenous Once  . ondansetron      . ondansetron (ZOFRAN) IV  4 mg Intravenous Once  . sodium chloride  1,000 mL Intravenous Once   Continuous Infusions:   . dextrose 5 % and 0.45 % NaCl with KCl 20 mEq/L 125 mL/hr at 07/31/11 0700   PRN Meds:.bisacodyl, ondansetron (ZOFRAN) IV, ondansetron, promethazine, promethazine, zolpidem  Assessment/Plan: 1. Irretractable vomiting likely due to viral gastroenteritis versus possible food poisoning.  2. Dehydration secondary to #1, resolved with IV hydration. 3. Ketonuria  4. Constipation.  Started the patient on stool softeners and suppository.  Discharge patient home today.   LOS: 2 days  Penny Arrambide A, MD 07/31/2011, 2:12 PM

## 2011-12-08 ENCOUNTER — Encounter: Payer: Self-pay | Admitting: Internal Medicine

## 2011-12-08 ENCOUNTER — Telehealth: Payer: Self-pay

## 2011-12-08 ENCOUNTER — Ambulatory Visit (INDEPENDENT_AMBULATORY_CARE_PROVIDER_SITE_OTHER): Payer: No Typology Code available for payment source | Admitting: Internal Medicine

## 2011-12-08 ENCOUNTER — Ambulatory Visit: Payer: No Typology Code available for payment source

## 2011-12-08 VITALS — BP 110/76 | HR 106 | Temp 97.9°F | Resp 16 | Ht 62.0 in | Wt 92.0 lb

## 2011-12-08 DIAGNOSIS — S022XXA Fracture of nasal bones, initial encounter for closed fracture: Secondary | ICD-10-CM

## 2011-12-08 DIAGNOSIS — S0003XA Contusion of scalp, initial encounter: Secondary | ICD-10-CM

## 2011-12-08 DIAGNOSIS — S0033XA Contusion of nose, initial encounter: Secondary | ICD-10-CM

## 2011-12-08 MED ORDER — HYDROCODONE-ACETAMINOPHEN 5-500 MG PO TABS: 1.0000 | ORAL_TABLET | Freq: Four times a day (QID) | ORAL | Status: AC | PRN

## 2011-12-08 NOTE — Telephone Encounter (Signed)
Patient called again stating that she was in pain still and just would like a call back.
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
## 2011-12-09 MED ORDER — OXYCODONE-ACETAMINOPHEN 5-325 MG PO TABS: 1.0000 | ORAL_TABLET | Freq: Three times a day (TID) | ORAL | Status: AC | PRN

## 2011-12-09 NOTE — Telephone Encounter (Signed)
Spoke with patient she states that shes not getting any sleep and that it hurt when she move or breath and medicine isnt working got to take pain medicine every 30 min.

## 2011-12-09 NOTE — Telephone Encounter (Signed)
Addended by: Sondra Barges on: 12/09/2011 02:17 PM   Modules accepted: Orders

## 2011-12-09 NOTE — Telephone Encounter (Signed)
.  [facility] The patient called again to request a stronger pain medication for her fractured nose.  Patient states that she did not sleep at all last night due to the pain.  Please call patient at [phone].

## 2011-12-09 NOTE — Telephone Encounter (Signed)
[facility] notifying patient that rx is ready to pickup.

## 2011-12-09 NOTE — Telephone Encounter (Signed)
Done and printed

## 2012-02-16 ENCOUNTER — Encounter (HOSPITAL_COMMUNITY): Payer: Self-pay | Admitting: *Deleted

## 2012-02-16 ENCOUNTER — Emergency Department (HOSPITAL_COMMUNITY)
Admission: EM | Admit: 2012-02-16 | Discharge: 2012-02-16 | Disposition: A | Discharge status: Home / Self Care | Payer: Self-pay | Attending: Emergency Medicine | Admitting: Emergency Medicine

## 2012-02-16 DIAGNOSIS — K089 Disorder of teeth and supporting structures, unspecified: Secondary | ICD-10-CM | POA: Insufficient documentation

## 2012-02-16 DIAGNOSIS — K0889 Other specified disorders of teeth and supporting structures: Secondary | ICD-10-CM

## 2012-02-16 MED ORDER — OXYCODONE-ACETAMINOPHEN 5-325 MG PO TABS: 1.0000 | ORAL_TABLET | Freq: Four times a day (QID) | ORAL | Status: AC | PRN

## 2012-02-16 MED ORDER — PENICILLIN V POTASSIUM 500 MG PO TABS: 500.0000 mg | ORAL_TABLET | Freq: Four times a day (QID) | ORAL | Status: AC

## 2012-02-16 NOTE — ED Provider Notes (Signed)
Medical screening examination/treatment/procedure(s) were performed by non-physician practitioner and as supervising physician I was immediately available for consultation/collaboration.  Karnell Vanderloop L Maurine Mowbray, MD 02/16/12 2354 

## 2012-02-16 NOTE — ED Notes (Signed)
Pt presents with multiple decayed teeth, states "I've already had some pulled and trying to save money, I don't want to lose all my teeth"

## 2012-02-16 NOTE — ED Provider Notes (Signed)
History     CSN: [id number]  Arrival date & time 02/16/12  1638   First MD Initiated Contact with Patient 02/16/12 1734      Chief Complaint  Patient presents with  . Dental Pain    multiple decayed teeth    (Consider location/radiation/quality/duration/timing/severity/associated sxs/prior treatment) HPI Comments: Patient presenting with dental pain.  She does not have a dentist.  She has been taking Ibuprofen for the pain, which provides mild relief.  Patient is a 26 y.o. female presenting with tooth pain. The history is provided by the patient.  Dental PainThe primary symptoms include mouth pain. Primary symptoms do not include dental injury, oral bleeding, oral lesions, headaches, fever or sore throat. Episode onset: 2 weeks ago. The symptoms are worsening. The symptoms occur constantly.  Additional symptoms include: gum swelling, gum tenderness and jaw pain. Additional symptoms do not include: dental sensitivity to temperature, purulent gums, trismus, facial swelling, trouble swallowing, pain with swallowing, excessive salivation, taste disturbance, smell disturbance and drooling.    Past Medical History  Diagnosis Date  . No pertinent past medical history     Past Surgical History  Procedure Date  . No past surgeries     No family history on file.  History  Substance Use Topics  . Smoking status: Never Smoker   . Smokeless tobacco: Never Used  . Alcohol Use: No    OB History    Grav Para Term Preterm Abortions TAB SAB Ect Mult Living                  Review of Systems  Constitutional: Negative for fever and chills.  HENT: Positive for dental problem. Negative for sore throat, facial swelling, drooling, trouble swallowing, neck pain and neck stiffness.   Gastrointestinal: Negative for nausea and vomiting.  Skin: Negative for color change.  Neurological: Negative for headaches.    Allergies  Review of patient's allergies indicates no known  allergies.  Home Medications   Current Outpatient Rx  Name Route Sig Dispense Refill  . IBUPROFEN 200 MG PO TABS Oral Take 800 mg by mouth every 4 (four) hours as needed. For pain.      BP 125/84  Pulse 93  Temp 98.1 F (36.7 C) (Oral)  Resp 18  Wt 95 lb (43.092 kg)  SpO2 100%  LMP 02/15/2012  Physical Exam  Nursing note and vitals reviewed. Constitutional: She is oriented to person, place, and time. She appears well-developed and well-nourished. No distress.  HENT:  Head: Normocephalic and atraumatic. No trismus in the jaw.  Mouth/Throat: Uvula is midline, oropharynx is clear and moist and mucous membranes are normal. Abnormal dentition. No dental abscesses or uvula swelling. No oropharyngeal exudate, posterior oropharyngeal edema, posterior oropharyngeal erythema or tonsillar abscesses.       Poor dental hygiene. Pt able to open and close mouth with out difficulty. Airway intact. Uvula midline. Mild gingival swelling with tenderness over affected area, but no fluctuance. No swelling or tenderness of submental and submandibular regions.  Eyes: Conjunctivae and EOM are normal.  Neck: Normal range of motion and full passive range of motion without pain. Neck supple.  Cardiovascular: Normal rate and regular rhythm.   Pulmonary/Chest: Effort normal and breath sounds normal. No stridor. No respiratory distress. She has no wheezes.  Musculoskeletal: Normal range of motion.  Lymphadenopathy:       Head (right side): No submental, no submandibular, no tonsillar, no preauricular and no posterior auricular adenopathy present.  Head (left side): No submental, no submandibular, no tonsillar, no preauricular and no posterior auricular adenopathy present.    She has no cervical adenopathy.  Neurological: She is alert and oriented to person, place, and time.  Skin: Skin is warm and dry. No rash noted. She is not diaphoretic.    ED Course  Procedures (including critical care  time)  Labs Reviewed - No data to display No results found.   No diagnosis found.    MDM  Patient with toothache.  No gross abscess.  Exam unconcerning for Ludwig's angina or spread of infection.  Will treat with penicillin and pain medicine.  Urged patient to follow-up with dentist.          Pascal Lux Hart, PA-C 02/16/12 2218

## 2012-03-04 ENCOUNTER — Emergency Department (HOSPITAL_COMMUNITY)
Admission: EM | Admit: 2012-03-04 | Discharge: 2012-03-04 | Disposition: A | Discharge status: Home / Self Care | Payer: No Typology Code available for payment source | Attending: Emergency Medicine | Admitting: Emergency Medicine

## 2012-03-04 ENCOUNTER — Encounter (HOSPITAL_COMMUNITY): Payer: Self-pay | Admitting: Emergency Medicine

## 2012-03-04 DIAGNOSIS — K029 Dental caries, unspecified: Secondary | ICD-10-CM | POA: Insufficient documentation

## 2012-03-04 DIAGNOSIS — K0889 Other specified disorders of teeth and supporting structures: Secondary | ICD-10-CM

## 2012-03-04 MED ORDER — OXYCODONE-ACETAMINOPHEN 5-325 MG PO TABS: 1.0000 | ORAL_TABLET | Freq: Four times a day (QID) | ORAL | Status: AC | PRN

## 2012-03-04 MED ORDER — CLINDAMYCIN HCL 150 MG PO CAPS: 150.0000 mg | ORAL_CAPSULE | Freq: Four times a day (QID) | ORAL | Status: AC

## 2012-03-04 NOTE — ED Notes (Signed)
Reports tooth pain for a while, has dentist appt for Saturday, but pain has gotten severe; reports L lower and upper molars are painful; denies difficulty swallowing/breathing

## 2012-03-04 NOTE — ED Provider Notes (Signed)
History   This chart was scribed for Katrina Chick, MD by Shari Heritage. The patient was seen in room TR09C/TR09C. Patient's care was started at 1734.     CSN: [id number]  Arrival date & time 03/04/12  1734   First MD Initiated Contact with Patient 03/04/12 1756      Chief Complaint  Patient presents with  . Dental Pain    (Consider location/radiation/quality/duration/timing/severity/associated sxs/prior treatment) Patient is a 26 y.o. female presenting with tooth pain. The history is provided by the patient. No language interpreter was used.  Dental PainThe primary symptoms include mouth pain. The symptoms began more than 1 week ago. The symptoms are worsening. The symptoms are recurrent. The symptoms occur constantly.  Mouth pain began more than 1 week ago. Mouth pain occurs constantly. Mouth pain is worsening. Affected locations include: teeth and gum(s). At its highest the mouth pain was at 10/10. The mouth pain is currently at 10/10.   [name] is a 26 y.o. female who presents to the Emergency Department complaining of moderate to severe left lower and left upper molar (dental) pain onset more than 2 weeks ago. She rates the pain as 10/10. Patient reports no other significant medical or surgical history. Patient has never smoked.  Previous Chart Review: Patient was seen on 02/16/12 at [facility] for the same pain. Patient presented with gingival swelling and tenderness of gums over the affected areas. Patient was discharged with penicillin and pain medication. Was instructed to follow up with a dentist for full treatment. Patient states that she finished her course of antibiotics 1 week ago and ran out of pain medication 1 week after her visit on 02/16/12. Patient has a dental appointment on Saturday at [facility].   Past Medical History  Diagnosis Date  . No pertinent past medical history     Past Surgical History  Procedure Date  . No past  surgeries     History reviewed. No pertinent family history.  History  Substance Use Topics  . Smoking status: Never Smoker   . Smokeless tobacco: Never Used  . Alcohol Use: No    OB History    Grav Para Term Preterm Abortions TAB SAB Ect Mult Living                  Review of Systems  HENT: Positive for dental problem.   All other systems reviewed and are negative.    Allergies  Review of patient's allergies indicates no known allergies.  Home Medications   Current Outpatient Rx  Name Route Sig Dispense Refill  . ACETAMINOPHEN 500 MG PO TABS Oral Take 500 mg by mouth every 6 (six) hours as needed. For pain.    Marland Kitchen CLINDAMYCIN HCL 150 MG PO CAPS Oral Take 1 capsule (150 mg total) by mouth every 6 (six) hours. 28 capsule 0  . OXYCODONE-ACETAMINOPHEN 5-325 MG PO TABS Oral Take 1-2 tablets by mouth every 6 (six) hours as needed for pain. 15 tablet 0    BP 122/80  Pulse 98  Temp 98.1 F (36.7 C) (Oral)  Resp 18  SpO2 98%  LMP 02/15/2012  Physical Exam  Nursing note and vitals reviewed. Constitutional: She is oriented to person, place, and time. She appears well-developed and well-nourished. No distress.  HENT:  Head: Normocephalic and atraumatic.  Mouth/Throat: Abnormal dentition. No dental abscesses.       Widespread dental decay. Erosion of left lower posterior molar as well as right upper  posterior molar. No abscess. No swelling of tongue.  Eyes: EOM are normal. Pupils are equal, round, and reactive to light.  Neck: Neck supple. No tracheal deviation present.  Musculoskeletal: Normal range of motion. She exhibits no edema.  Neurological: She is alert and oriented to person, place, and time. No sensory deficit.  Skin: Skin is warm and dry.  Psychiatric: She has a normal mood and affect. Her behavior is normal.    ED Course  Procedures (including critical care time) DIAGNOSTIC STUDIES: Oxygen Saturation is 98% on room air, normal by my interpretation.     COORDINATION OF CARE: 6:04pm- Patient informed of current plan for treatment and evaluation and agrees with plan at this time.     Labs Reviewed - No data to display  No results found.   1. Pain, dental       MDM  Pt presenting with dental pain.  She has taken course of pcn and states pain did improved for approx 1 week, then returned.  She does not have evidence of abscess or other deep space infection.  She states she has dental follow up arranged in 2 days.  Given rx for pain meds as well as clindamycin- Discharged with strict return precautions.  Pt agreeable with plan.     I personally performed the services described in this documentation, which was scribed in my presence. The recorded information has been reviewed and considered.    Katrina Chick, MD 03/05/12 [phone]

## 2012-08-04 NOTE — L&D Delivery Note (Signed)
Delivery Note At 9:14 PM a viable and healthy female was delivered via  (Presentation: LOA; ROT  ).  APGAR: 9, ; weight P.   Placenta status: Intact, Spontaneous.  Cord: 3 vessels with the following complications: None.    Anesthesia: Epidural  Episiotomy: none Lacerations: B labial abrasion R repaired, L hemostatic Suture Repair: 3.0 vicryl rapide Est. Blood Loss (mL): 400cc  Mom to postpartum.  Baby to stay with Mom and Dad.  [name],Katrina Wiswell 03/29/2013, 9:37 PM  Br/?Depo/B+/RI

## 2012-08-30 ENCOUNTER — Emergency Department (HOSPITAL_COMMUNITY): Payer: 59

## 2012-08-30 ENCOUNTER — Encounter (HOSPITAL_COMMUNITY): Payer: Self-pay | Admitting: *Deleted

## 2012-08-30 ENCOUNTER — Emergency Department (HOSPITAL_COMMUNITY)
Admission: EM | Admit: 2012-08-30 | Discharge: 2012-08-30 | Disposition: A | Discharge status: Home / Self Care | Payer: 59 | Attending: Emergency Medicine | Admitting: Emergency Medicine

## 2012-08-30 DIAGNOSIS — M549 Dorsalgia, unspecified: Secondary | ICD-10-CM

## 2012-08-30 DIAGNOSIS — R269 Unspecified abnormalities of gait and mobility: Secondary | ICD-10-CM | POA: Insufficient documentation

## 2012-08-30 DIAGNOSIS — Y929 Unspecified place or not applicable: Secondary | ICD-10-CM | POA: Insufficient documentation

## 2012-08-30 DIAGNOSIS — IMO0002 Reserved for concepts with insufficient information to code with codable children: Secondary | ICD-10-CM | POA: Insufficient documentation

## 2012-08-30 DIAGNOSIS — R296 Repeated falls: Secondary | ICD-10-CM | POA: Insufficient documentation

## 2012-08-30 DIAGNOSIS — M5430 Sciatica, unspecified side: Secondary | ICD-10-CM | POA: Insufficient documentation

## 2012-08-30 DIAGNOSIS — R209 Unspecified disturbances of skin sensation: Secondary | ICD-10-CM | POA: Insufficient documentation

## 2012-08-30 DIAGNOSIS — Y9301 Activity, walking, marching and hiking: Secondary | ICD-10-CM | POA: Insufficient documentation

## 2012-08-30 MED ORDER — HYDROMORPHONE HCL PF 1 MG/ML IJ SOLN
1.0000 mg | Freq: Once | INTRAMUSCULAR | Status: AC
Start: 2012-08-30 — End: 2012-08-30
  Administered 2012-08-30: 1 mg via INTRAVENOUS
  Filled 2012-08-30: qty 1

## 2012-08-30 MED ORDER — IBUPROFEN 600 MG PO TABS: 600.0000 mg | ORAL_TABLET | Freq: Four times a day (QID) | ORAL | Status: DC | PRN

## 2012-08-30 MED ORDER — HYDROCODONE-ACETAMINOPHEN 5-325 MG PO TABS: 2.0000 | ORAL_TABLET | ORAL | Status: DC | PRN

## 2012-08-30 MED ORDER — IBUPROFEN 800 MG PO TABS
800.0000 mg | ORAL_TABLET | Freq: Once | ORAL | Status: AC
  Administered 2012-08-30: 800 mg via ORAL
  Filled 2012-08-30: qty 1

## 2012-08-30 NOTE — ED Notes (Signed)
Pt reports fall last night while walking her 70lb dog.  Pt reports R low back pain, unable to lift her R leg d/t pain.  Pt reports numbness and tingling in her R low back.

## 2012-08-30 NOTE — ED Notes (Signed)
Please see triage note

## 2012-08-31 ENCOUNTER — Ambulatory Visit (INDEPENDENT_AMBULATORY_CARE_PROVIDER_SITE_OTHER): Payer: 59 | Admitting: Family Medicine

## 2012-08-31 VITALS — BP 115/78 | HR 92 | Temp 98.3°F | Resp 16 | Ht 61.5 in | Wt 99.0 lb

## 2012-08-31 DIAGNOSIS — S39012A Strain of muscle, fascia and tendon of lower back, initial encounter: Secondary | ICD-10-CM

## 2012-08-31 DIAGNOSIS — S335XXA Sprain of ligaments of lumbar spine, initial encounter: Secondary | ICD-10-CM

## 2012-08-31 MED ORDER — CYCLOBENZAPRINE HCL 10 MG PO TABS: 10.0000 mg | ORAL_TABLET | Freq: Three times a day (TID) | ORAL | Status: DC | PRN

## 2012-08-31 MED ORDER — METHYLPREDNISOLONE 4 MG PO KIT: PACK | ORAL | Status: DC

## 2012-08-31 MED ORDER — OXYCODONE-ACETAMINOPHEN 5-325 MG PO TABS: 1.0000 | ORAL_TABLET | Freq: Three times a day (TID) | ORAL | Status: DC | PRN

## 2012-08-31 NOTE — ED Provider Notes (Signed)
History     CSN: [id number]  Arrival date & time 08/30/12  1308   First MD Initiated Contact with Patient 08/30/12 1341      Chief Complaint  Patient presents with  . Fall  . Back Pain    (Consider location/radiation/quality/duration/timing/severity/associated sxs/prior treatment) Patient is a 27 y.o. female presenting with fall and back pain. The history is provided by the patient and a friend. No language interpreter was used.  Fall The accident occurred yesterday. The fall occurred while walking. She landed on grass. There was no blood loss. The pain is at a severity of 10/10. The pain is severe. She was ambulatory at the scene. There was no entrapment after the fall. There was no drug use involved in the accident. There was no alcohol use involved in the accident. Associated symptoms include tingling. Pertinent negatives include no fever, no nausea, no vomiting and no loss of consciousness. The symptoms are aggravated by activity and ambulation. She has tried acetaminophen for the symptoms.  Back Pain  Associated symptoms include tingling. Pertinent negatives include no fever.  27yo female was walking her dog yesterday and fell.  C/o R paraspinal pain that radiates down R thigh.  Denies bowel or bladder incontinence. Tingling to R hip.  Has taken tylenol for pain with no relief.    Past Medical History  Diagnosis Date  . No pertinent past medical history     Past Surgical History  Procedure Date  . No past surgeries     No family history on file.  History  Substance Use Topics  . Smoking status: Never Smoker   . Smokeless tobacco: Never Used  . Alcohol Use: No    OB History    Grav Para Term Preterm Abortions TAB SAB Ect Mult Living                  Review of Systems  Constitutional: Negative.  Negative for fever.  HENT: Negative.   Eyes: Negative.   Respiratory: Negative.   Cardiovascular: Negative.   Gastrointestinal: Negative.  Negative for nausea and  vomiting.  Musculoskeletal: Positive for back pain and gait problem.  Neurological: Positive for tingling. Negative for loss of consciousness.  Psychiatric/Behavioral: Negative.   All other systems reviewed and are negative.    Allergies  Review of patient's allergies indicates no known allergies.  Home Medications   Current Outpatient Rx  Name  Route  Sig  Dispense  Refill  . ACETAMINOPHEN 500 MG PO TABS   Oral   Take 500 mg by mouth every 6 (six) hours as needed. For pain.         Marland Kitchen HYDROCODONE-ACETAMINOPHEN 5-325 MG PO TABS   Oral   Take 2 tablets by mouth every 4 (four) hours as needed for pain.   20 tablet   0   . IBUPROFEN 600 MG PO TABS   Oral   Take 1 tablet (600 mg total) by mouth every 6 (six) hours as needed for pain.   30 tablet   0     BP 100/79  Pulse 117  Temp 98.6 F (37 C) (Oral)  Resp 20  SpO2 100%  LMP 08/02/2012  Physical Exam  Nursing note and vitals reviewed. Constitutional: She is oriented to person, place, and time. She appears well-developed and well-nourished.  HENT:  Head: Normocephalic and atraumatic.  Eyes: Conjunctivae normal and EOM are normal. Pupils are equal, round, and reactive to light.  Neck: Normal range of motion. Neck  supple.  Cardiovascular: Normal rate.   Pulmonary/Chest: Effort normal.  Abdominal: Soft.  Musculoskeletal: Normal range of motion. She exhibits tenderness. She exhibits no edema.       R paraspinal lumbar, buttocks and posterior thigh  tenderness with limited ROM due to pain  No step off or point tenderness to spine.  Neurological: She is alert and oriented to person, place, and time. She displays normal reflexes.  Skin: Skin is warm and dry.  Psychiatric: She has a normal mood and affect.    ED Course  Procedures (including critical care time)  Labs Reviewed - No data to display Dg Lumbar Spine Complete  08/30/2012  *RADIOLOGY REPORT*  Clinical Data: Post fall, now with right-sided back pain   LUMBAR SPINE - COMPLETE 4+ VIEW  Comparison: CT abdomen pelvis - 01/20/2004  Findings:  There are five non-rib bearing lumbar type vertebral bodies. Normal alignment of the lumbar spine.  No anterolisthesis or retrolisthesis.  No definite pars defects.  Lumbar vertebral body heights are preserved.  Intervertebral disc spaces are preserved.  Limited visualization of the bilateral SI joints is normal. Regional bowel gas pattern and soft tissues are normal.  IMPRESSION: No explanation for patient's low back pain.   Original Report Authenticated By: Tacey Ruiz, MD      1. Sciatica   2. Back pain       MDM  R paraspinal pain with sciatica.  IM dilaudid and ibuprofen in the ER. rx for vicodin and ibuprofen/ice.  Follow up with pcp from list.  She understands to return for weakness or cauda equina symptoms.  X-ray shows no fractures reviewed by myself.         Remi Haggard, NP 08/31/12 [phone]
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
## 2012-08-31 NOTE — Patient Instructions (Addendum)

## 2012-08-31 NOTE — ED Provider Notes (Signed)
Medical screening examination/treatment/procedure(s) were performed by non-physician practitioner and as supervising physician I was immediately available for consultation/collaboration.  Ludivina Guymon, MD 08/31/12 1807 

## 2012-10-24 ENCOUNTER — Ambulatory Visit (INDEPENDENT_AMBULATORY_CARE_PROVIDER_SITE_OTHER): Payer: 59 | Admitting: Physician Assistant

## 2012-10-24 VITALS — BP 106/68 | HR 109 | Temp 98.0°F | Resp 18 | Ht 62.5 in | Wt 103.2 lb

## 2012-10-24 DIAGNOSIS — J101 Influenza due to other identified influenza virus with other respiratory manifestations: Secondary | ICD-10-CM

## 2012-10-24 DIAGNOSIS — R05 Cough: Secondary | ICD-10-CM

## 2012-10-24 DIAGNOSIS — J111 Influenza due to unidentified influenza virus with other respiratory manifestations: Secondary | ICD-10-CM

## 2012-10-24 LAB — POCT CBC
Hemoglobin: 12.2 g/dL (ref 12.2–16.2)
Lymph, poc: 1.1 (ref 0.6–3.4)
MCHC: 32.4 g/dL (ref 31.8–35.4)
MPV: 8.4 fL (ref 0–99.8)
POC Granulocyte: 4.9 (ref 2–6.9)
POC LYMPH PERCENT: 17.9 %L (ref 10–50)
POC MID %: 5 %M (ref 0–12)
Platelet Count, POC: 215 10*3/uL (ref 142–424)
RDW, POC: 13.1 %

## 2012-10-24 LAB — POCT INFLUENZA A/B: Influenza A, POC: POSITIVE

## 2012-10-24 MED ORDER — BENZONATATE 100 MG PO CAPS: 100.0000 mg | ORAL_CAPSULE | Freq: Three times a day (TID) | ORAL | Status: DC | PRN

## 2012-10-24 MED ORDER — HYDROCODONE-HOMATROPINE 5-1.5 MG/5ML PO SYRP: 5.0000 mL | ORAL_SOLUTION | Freq: Three times a day (TID) | ORAL | Status: DC | PRN

## 2012-10-24 NOTE — Progress Notes (Signed)
Subjective:    Patient ID: [name], female    DOB: 1985-10-25, 27 y.o.   MRN: [id number]  HPI   [name] is a pleasant 27 yr old female here with concern for illness.  Symptoms began abruptly 3 days ago.  Her chest started hurting and she began "coughing belligerently".  The cough is mostly non-productive.  Keeps her awake at night.  Two episodes of post-tussive emesis.  "My chest hurts so bad."  Also having cold chills.  Has not taken temp but "I'm sure I've had a fever."  Also some runny nose, sore throat, and HA.  Some SOB but no wheezing.  No history of asthma, no smoking.  No known sick contacts.     Review of Systems  Constitutional: Positive for fever (subjective) and chills.  HENT: Positive for congestion, sore throat and rhinorrhea.   Respiratory: Positive for cough and shortness of breath.   Cardiovascular: Positive for chest pain (with cough).  Gastrointestinal: Negative.   Musculoskeletal: Negative.   Skin: Negative.   Neurological: Positive for headaches.       Objective:   Physical Exam  Vitals reviewed. Constitutional: She is oriented to person, place, and time. She appears well-developed and well-nourished. No distress.  HENT:  Head: Normocephalic and atraumatic.  Right Ear: Tympanic membrane and ear canal normal.  Left Ear: Tympanic membrane and ear canal normal.  Nose: Mucosal edema and rhinorrhea present.  Mouth/Throat: Uvula is midline, oropharynx is clear and moist and mucous membranes are normal.  Eyes: Conjunctivae are normal. No scleral icterus.  Neck: Neck supple.  Cardiovascular: Regular rhythm and normal heart sounds.  Tachycardia present.   Pulmonary/Chest: Effort normal and breath sounds normal. She has no wheezes. She has no rales.  Lymphadenopathy:    She has cervical adenopathy.  Neurological: She is alert and oriented to person, place, and time.  Skin: Skin is warm and dry.  Psychiatric: She has a normal mood and affect. Her behavior is  normal.     Filed Vitals:   10/24/12 1618  BP: 106/68  Pulse: 109  Temp: 98 F (36.7 C)  Resp: 18     Results for orders placed in visit on 10/24/12  POCT CBC      Result Value Range   WBC 6.3  4.6 - 10.2 K/uL   Lymph, poc 1.1  0.6 - 3.4   POC LYMPH PERCENT 17.9  10 - 50 %L   MID (cbc) 0.3  0 - 0.9   POC MID % 5.0  0 - 12 %M   POC Granulocyte 4.9  2 - 6.9   Granulocyte percent 77.1  37 - 80 %G   RBC 4.15  4.04 - 5.48 M/uL   Hemoglobin 12.2  12.2 - 16.2 g/dL   HCT, POC 08.6 (*) 57.8 - 47.9 %   MCV 90.7  80 - 97 fL   MCH, POC 29.4  27 - 31.2 pg   MCHC 32.4  31.8 - 35.4 g/dL   RDW, POC 46.9     Platelet Count, POC 215  142 - 424 K/uL   MPV 8.4  0 - 99.8 fL  POCT INFLUENZA A/B      Result Value Range   Influenza A, POC Positive     Influenza B, POC Negative          Assessment & Plan:  Influenza A  Cough - Plan: POCT CBC, POCT Influenza A/B, benzonatate (TESSALON) 100 MG capsule, HYDROcodone-homatropine (HYCODAN) 5-1.5 MG/5ML  syrup  [name] is a pleasant 27 yr old female here with influenza.  Unfortunately she is outside the window in which we can treat with antivirals.  Will treat cough with Tessalon and Hycodan.  Encouraged plenty of fluids and rest.  Work note provided.  Discussed with pt that symptoms may last for several days, cough may linger even longer.  If worsening or not improving pt will RTC.

## 2012-10-24 NOTE — Patient Instructions (Addendum)
Use Tessalon for cough during the day.  Hycodan for cough at night - this will make you sleepy.  Plenty of fluids and rest.  Please let me know if you are worsening or not improving.     Influenza Facts Flu (influenza) is a contagious respiratory illness caused by the influenza viruses. It can cause mild to severe illness. While most healthy people recover from the flu without specific treatment and without complications, older people, young children, and people with certain health conditions are at higher risk for serious complications from the flu, including death. CAUSES   The flu virus is spread from person to person by respiratory droplets from coughing and sneezing.  A person can also become infected by touching an object or surface with a virus on it and then touching their mouth, eye or nose.  Adults may be able to infect others from 1 day before symptoms occur and up to 7 days after getting sick. So it is possible to give someone the flu even before you know you are sick and continue to infect others while you are sick. SYMPTOMS   Fever (usually high).  Headache.  Tiredness (can be extreme).  Cough.  Sore throat.  Runny or stuffy nose.  Body aches.  Diarrhea and vomiting may also occur, particularly in children.  These symptoms are referred to as "flu-like symptoms". A lot of different illnesses, including the common cold, can have similar symptoms. DIAGNOSIS   There are tests that can determine if you have the flu as long you are tested within the first 2 or 3 days of illness.  A doctor's exam and additional tests may be needed to identify if you have a disease that is a complicating the flu. RISKS AND COMPLICATIONS  Some of the complications caused by the flu include:  Bacterial pneumonia or progressive pneumonia caused by the flu virus.  Loss of body fluids (dehydration).  Worsening of chronic medical conditions, such as heart failure, asthma, or  diabetes.  Sinus problems and ear infections. HOME CARE INSTRUCTIONS   Seek medical care early on.  If you are at high risk from complications of the flu, consult your health-care provider as soon as you develop flu-like symptoms. Those at high risk for complications include:  People 65 years or older.  People with chronic medical conditions, including diabetes.  Pregnant women.  Young children.  Your caregiver may recommend use of an antiviral medication to help treat the flu.  If you get the flu, get plenty of rest, drink a lot of liquids, and avoid using alcohol and tobacco.  You can take over-the-counter medications to relieve the symptoms of the flu if your caregiver approves. (Never give aspirin to children or teenagers who have flu-like symptoms, particularly fever). PREVENTION  The single best way to prevent the flu is to get a flu vaccine each fall. Other measures that can help protect against the flu are:  Antiviral Medications  A number of antiviral drugs are approved for use in preventing the flu. These are prescription medications, and a doctor should be consulted before they are used.  Habits for Good Health  Cover your nose and mouth with a tissue when you cough or sneeze, throw the tissue away after you use it.  Wash your hands often with soap and water, especially after you cough or sneeze. If you are not near water, use an alcohol-based hand cleaner.  Avoid people who are sick.  If you get the flu, stay  home from work or school. Avoid contact with other people so that you do not make them sick, too.  Try not to touch your eyes, nose, or mouth as germs ore often spread this way. IN CHILDREN, EMERGENCY WARNING SIGNS THAT NEED URGENT MEDICAL ATTENTION:  Fast breathing or trouble breathing.  Bluish skin color.  Not drinking enough fluids.  Not waking up or not interacting.  Being so irritable that the child does not want to be held.  Flu-like symptoms  improve but then return with fever and worse cough.  Fever with a rash. IN ADULTS, EMERGENCY WARNING SIGNS THAT NEED URGENT MEDICAL ATTENTION:  Difficulty breathing or shortness of breath.  Pain or pressure in the chest or abdomen.  Sudden dizziness.  Confusion.  Severe or persistent vomiting. SEEK IMMEDIATE MEDICAL CARE IF:  You or someone you know is experiencing any of the symptoms above. When you arrive at the emergency center,report that you think you have the flu. You may be asked to wear a mask and/or sit in a secluded area to protect others from getting sick. MAKE SURE YOU:   Understand these instructions.  Monitor your condition.  Seek medical care if you are getting worse, or not improving. Document Released: 07/24/2003 Document Revised: 10/13/2011 Document Reviewed: 04/19/2009 [facility] Patient Information 2013 [place], [place].

## 2012-11-01 ENCOUNTER — Ambulatory Visit (INDEPENDENT_AMBULATORY_CARE_PROVIDER_SITE_OTHER): Payer: 59 | Admitting: Physician Assistant

## 2012-11-01 VITALS — BP 104/70 | HR 97 | Temp 98.0°F | Resp 16 | Ht 61.0 in | Wt 103.4 lb

## 2012-11-01 DIAGNOSIS — N912 Amenorrhea, unspecified: Secondary | ICD-10-CM

## 2012-11-01 DIAGNOSIS — Z3201 Encounter for pregnancy test, result positive: Secondary | ICD-10-CM

## 2012-11-01 MED ORDER — PRENATAL VITAMINS 28-0.8 MG PO TABS: 1.0000 | ORAL_TABLET | Freq: Every day | ORAL | Status: DC

## 2012-11-01 NOTE — Progress Notes (Signed)
  Subjective:    Patient ID: [name], female    DOB: [birth year], 27 y.o.   MRN: [id number]  HPI   [name] is a very pleasant 27 yr old female here after a positive home pregnancy test 3 days ago.  LMP was 5 weeks ago.  Was supposed to have a period last week but didn't, which prompted her to take the test.  She states she and her boyfriend were not trying to get pregnant, but they have been together for 4.5 yrs and are "ok with it."  States she is feeling well thus far.  Has noted an increased appetite, also feels like her belly is growing already.  Does have some concern that she may be further along than 5 weeks.  Pt was pregnant about 5-6 yrs ago, and had a normal period during that pregnancy.  She had a normal delivery and the baby was given up for adoption.  She does not have an OB/GYN yet, would like a referral.  Has not begun taking prenatal vitamins yet.   I actually saw the patient about a week ago for influenza.  At that time she was given Hycodan and Tessalon for cough.  She has since stopped these.     Review of Systems  Constitutional: Negative for fever and chills.  Respiratory: Negative.   Cardiovascular: Negative.   Gastrointestinal: Negative for nausea and vomiting.  Genitourinary: Negative for vaginal bleeding and vaginal discharge.  Musculoskeletal: Negative.   Skin: Negative.   Neurological: Negative.        Objective:   Physical Exam  Vitals reviewed. Constitutional: She is oriented to person, place, and time. She appears well-developed and well-nourished. No distress.  HENT:  Head: Normocephalic and atraumatic.  Eyes: Conjunctivae are normal. No scleral icterus.  Cardiovascular: Normal rate, regular rhythm and normal heart sounds.   Pulmonary/Chest: Effort normal and breath sounds normal. She has no wheezes. She has no rales.  Abdominal: Soft. Bowel sounds are normal. There is no rebound.  Neurological: She is alert and oriented to person, place, and time.   Skin: Skin is warm and dry.  Psychiatric: She has a normal mood and affect. Her behavior is normal.    Filed Vitals:   11/01/12 1350  BP: 104/70  Pulse: 97  Temp: 98 F (36.7 C)  Resp: 16     Results for orders placed in visit on 11/01/12  POCT URINE PREGNANCY      Result Value Range   Preg Test, Ur Positive           Assessment & Plan:  Amenorrhea - Plan: Prenatal Vit-Fe Fumarate-FA (PRENATAL VITAMINS) 28-0.8 MG TABS, Ambulatory referral to Obstetrics / Gynecology  Positive pregnancy test - Plan: POCT urine pregnancy, Prenatal Vit-Fe Fumarate-FA (PRENATAL VITAMINS) 28-0.8 MG TABS, Ambulatory referral to Obstetrics / Gynecology   [name] is a very pleasant 27 yr old female here with amenorrhea and a positive pregnancy test.  Will start prenatal vitamins today.  Discussed nutrition and avoidance of etoh, smoking, etc.  Pt feels well thus far.  Requests referral to OB/GYN, which I have put in.  If needs arise before she gets in with OB, she will RTC.

## 2012-11-01 NOTE — Patient Instructions (Addendum)

## 2012-12-06 LAB — OB RESULTS CONSOLE ABO/RH: RH Type: POSITIVE

## 2012-12-06 LAB — OB RESULTS CONSOLE RUBELLA ANTIBODY, IGM: Rubella: IMMUNE

## 2012-12-06 LAB — OB RESULTS CONSOLE HEPATITIS B SURFACE ANTIGEN: Hepatitis B Surface Ag: NEGATIVE

## 2012-12-06 LAB — OB RESULTS CONSOLE HIV ANTIBODY (ROUTINE TESTING): HIV: NONREACTIVE

## 2013-03-16 ENCOUNTER — Encounter (HOSPITAL_COMMUNITY): Payer: Self-pay | Admitting: *Deleted

## 2013-03-16 ENCOUNTER — Telehealth (HOSPITAL_COMMUNITY): Payer: Self-pay | Admitting: *Deleted

## 2013-03-16 NOTE — Telephone Encounter (Signed)
Preadmission screen  

## 2013-03-22 ENCOUNTER — Telehealth (HOSPITAL_COMMUNITY): Payer: Self-pay | Admitting: *Deleted

## 2013-03-22 ENCOUNTER — Encounter (HOSPITAL_COMMUNITY): Payer: Self-pay | Admitting: *Deleted

## 2013-03-22 NOTE — Telephone Encounter (Signed)
Preadmission screen  

## 2013-03-29 ENCOUNTER — Encounter (HOSPITAL_COMMUNITY): Payer: Self-pay | Admitting: Anesthesiology

## 2013-03-29 ENCOUNTER — Inpatient Hospital Stay (HOSPITAL_COMMUNITY): Payer: 59 | Admitting: Anesthesiology

## 2013-03-29 ENCOUNTER — Encounter (HOSPITAL_COMMUNITY): Payer: Self-pay

## 2013-03-29 ENCOUNTER — Inpatient Hospital Stay (HOSPITAL_COMMUNITY)
Admission: RE | Admit: 2013-03-29 | Discharge: 2013-03-31 | DRG: 775 | Disposition: A | Discharge status: Home / Self Care | Payer: 59 | Source: Ambulatory Visit | Attending: Obstetrics and Gynecology | Admitting: Obstetrics and Gynecology

## 2013-03-29 DIAGNOSIS — O99891 Other specified diseases and conditions complicating pregnancy: Secondary | ICD-10-CM | POA: Diagnosis present

## 2013-03-29 DIAGNOSIS — O0930 Supervision of pregnancy with insufficient antenatal care, unspecified trimester: Secondary | ICD-10-CM

## 2013-03-29 LAB — CBC
Hemoglobin: 12.8 g/dL (ref 12.0–15.0)
MCH: 30.4 pg (ref 26.0–34.0)
RBC: 4.21 MIL/uL (ref 3.87–5.11)
WBC: 10.5 10*3/uL (ref 4.0–10.5)

## 2013-03-29 LAB — RPR: RPR Ser Ql: NONREACTIVE

## 2013-03-29 MED ORDER — LIDOCAINE HCL (PF) 1 % IJ SOLN
INTRAMUSCULAR | Status: DC | PRN
  Administered 2013-03-29 (×2): 9 mL

## 2013-03-29 MED ORDER — EPHEDRINE 5 MG/ML INJ
10.0000 mg | INTRAVENOUS | Status: DC | PRN
  Filled 2013-03-29: qty 2
  Filled 2013-03-29: qty 4

## 2013-03-29 MED ORDER — IBUPROFEN 600 MG PO TABS
600.0000 mg | ORAL_TABLET | Freq: Four times a day (QID) | ORAL | Status: DC | PRN
  Administered 2013-03-29: 600 mg via ORAL
  Filled 2013-03-29: qty 1

## 2013-03-29 MED ORDER — CITRIC ACID-SODIUM CITRATE 334-500 MG/5ML PO SOLN: 30.0000 mL | ORAL | Status: DC | PRN

## 2013-03-29 MED ORDER — OXYTOCIN BOLUS FROM INFUSION
500.0000 mL | INTRAVENOUS | Status: DC
  Administered 2013-03-29: 500 mL via INTRAVENOUS

## 2013-03-29 MED ORDER — DIPHENHYDRAMINE HCL 50 MG/ML IJ SOLN: 12.5000 mg | INTRAMUSCULAR | Status: DC | PRN

## 2013-03-29 MED ORDER — OXYCODONE-ACETAMINOPHEN 5-325 MG PO TABS: 1.0000 | ORAL_TABLET | ORAL | Status: DC | PRN

## 2013-03-29 MED ORDER — LIDOCAINE HCL (PF) 1 % IJ SOLN
30.0000 mL | INTRAMUSCULAR | Status: DC | PRN
  Filled 2013-03-29 (×2): qty 30

## 2013-03-29 MED ORDER — ONDANSETRON HCL 4 MG/2ML IJ SOLN
4.0000 mg | Freq: Four times a day (QID) | INTRAMUSCULAR | Status: DC | PRN
  Administered 2013-03-29: 4 mg via INTRAVENOUS
  Filled 2013-03-29: qty 2

## 2013-03-29 MED ORDER — ACETAMINOPHEN 325 MG PO TABS: 650.0000 mg | ORAL_TABLET | ORAL | Status: DC | PRN

## 2013-03-29 MED ORDER — FENTANYL 2.5 MCG/ML BUPIVACAINE 1/10 % EPIDURAL INFUSION (WH - ANES)
14.0000 mL/h | INTRAMUSCULAR | Status: DC | PRN
  Administered 2013-03-29: 14 mL/h via EPIDURAL
  Filled 2013-03-29 (×2): qty 125

## 2013-03-29 MED ORDER — TERBUTALINE SULFATE 1 MG/ML IJ SOLN: 0.2500 mg | Freq: Once | INTRAMUSCULAR | Status: AC | PRN

## 2013-03-29 MED ORDER — FENTANYL 2.5 MCG/ML BUPIVACAINE 1/10 % EPIDURAL INFUSION (WH - ANES)
INTRAMUSCULAR | Status: DC | PRN
  Administered 2013-03-29: 14 mL/h via EPIDURAL

## 2013-03-29 MED ORDER — PHENYLEPHRINE 40 MCG/ML (10ML) SYRINGE FOR IV PUSH (FOR BLOOD PRESSURE SUPPORT)
80.0000 ug | PREFILLED_SYRINGE | INTRAVENOUS | Status: DC | PRN
  Filled 2013-03-29: qty 2

## 2013-03-29 MED ORDER — OXYTOCIN 40 UNITS IN LACTATED RINGERS INFUSION - SIMPLE MED: 62.5000 mL/h | INTRAVENOUS | Status: DC

## 2013-03-29 MED ORDER — EPHEDRINE 5 MG/ML INJ
10.0000 mg | INTRAVENOUS | Status: DC | PRN
  Filled 2013-03-29: qty 2

## 2013-03-29 MED ORDER — LACTATED RINGERS IV SOLN
INTRAVENOUS | Status: DC
  Administered 2013-03-29 (×2): via INTRAVENOUS
  Administered 2013-03-29: 125 mL/h via INTRAVENOUS

## 2013-03-29 MED ORDER — LACTATED RINGERS IV SOLN
500.0000 mL | Freq: Once | INTRAVENOUS | Status: AC
  Administered 2013-03-29: 500 mL via INTRAVENOUS

## 2013-03-29 MED ORDER — PHENYLEPHRINE 40 MCG/ML (10ML) SYRINGE FOR IV PUSH (FOR BLOOD PRESSURE SUPPORT)
80.0000 ug | PREFILLED_SYRINGE | INTRAVENOUS | Status: DC | PRN
  Filled 2013-03-29: qty 5
  Filled 2013-03-29: qty 2

## 2013-03-29 MED ORDER — LACTATED RINGERS IV SOLN: 500.0000 mL | INTRAVENOUS | Status: DC | PRN

## 2013-03-29 MED ORDER — OXYTOCIN 40 UNITS IN LACTATED RINGERS INFUSION - SIMPLE MED
1.0000 m[IU]/min | INTRAVENOUS | Status: DC
  Administered 2013-03-29: 2 m[IU]/min via INTRAVENOUS
  Filled 2013-03-29: qty 1000

## 2013-03-29 NOTE — Anesthesia Procedure Notes (Addendum)
Epidural Patient location during procedure: OB Start time: 03/29/2013 8:50 AM End time: 03/29/2013 8:58 AM  Staffing Anesthesiologist: Sandrea Hughs Performed by: anesthesiologist   Preanesthetic Checklist Completed: patient identified, surgical consent, pre-op evaluation, timeout performed, IV checked, risks and benefits discussed and monitors and equipment checked  Epidural Patient position: sitting Prep: site prepped and draped and DuraPrep Patient monitoring: continuous pulse ox and blood pressure Approach: midline Injection technique: LOR air  Needle:  Needle type: Tuohy  Needle gauge: 17 G Needle length: 9 cm and 9 Needle insertion depth: 5 cm cm Catheter type: closed end flexible Catheter size: 19 Gauge Catheter at skin depth: 10 cm Test dose: negative and Other  Assessment Sensory level: T9 Events: blood not aspirated, injection not painful, no injection resistance, negative IV test and no paresthesia  Additional Notes Reason for block:procedure for pain

## 2013-03-29 NOTE — H&P (Signed)
[name] is a 27 y.o. female G3P1011 at 16+ for iol given term status and favorable cervix.  PNC complicated by late entry to care, pregnancy dated by 23 wk [place] and spider bite.  Mother CF +, FOB CF negative.  +FM, no LOF, no VB, occ ctx.  D/W pt IOL and POC Maternal Medical History:  Contractions: Frequency: irregular.    Fetal activity: Perceived fetal activity is normal.    Prenatal Complications - Diabetes: none.    OB History   Grav Para Term Preterm Abortions TAB SAB Ect Mult Living   3 2 1       1     G1 SVD 7# female G2 SAB  G3 present No abn pap, no STD  Past Medical History  Diagnosis Date  . No pertinent past medical history   . [id number](191.4)    Past Surgical History  Procedure Laterality Date  . No past surgeries    . Wisdom tooth extraction     Family History: family history includes Diabetes in her mother; Heart disease in her paternal grandfather; Hypertension in her mother and paternal grandfather. Social History:  reports that she has never smoked. She has never used smokeless tobacco. She reports that she does not drink alcohol or use illicit drugs.   Prenatal Transfer Tool  Maternal Diabetes: No Genetic Screening: Declined too late to care Maternal Ultrasounds/Referrals: Normal Fetal Ultrasounds or other Referrals:  None Maternal Substance Abuse:  No Significant Maternal Medications:  None Significant Maternal Lab Results:  Lab values include: Group B Strep negative Other Comments:  late entry to care (23wk) CF +, FOB neg  Review of Systems  Constitutional: Negative.   HENT: Negative.   Eyes: Negative.   Respiratory: Negative.   Cardiovascular: Negative.   Gastrointestinal: Negative.   Genitourinary: Negative.   Musculoskeletal: Negative.   Skin: Negative.   Neurological: Negative.   Endo/Heme/Allergies: Negative.   Psychiatric/Behavioral: Negative.     Dilation: 3 Effacement (%): 70 Station: -2;-1 Exam by:: RS Blood pressure  108/69, pulse 96, temperature 97.7 F (36.5 C), temperature source Oral, resp. rate 20, height 5\' 1"  (1.549 m), weight 63.504 kg (140 lb), last menstrual period 09/26/2012. Maternal Exam:  Uterine Assessment: Contraction strength is moderate.  Contraction frequency is irregular.   Abdomen: Fundal height is appropriate for gestation.   Estimated fetal weight is 7-8#.   Fetal presentation: vertex  Introitus: Normal vulva. Normal vagina.  Pelvis: adequate for delivery.   Cervix: Cervix evaluated by digital exam.     Physical Exam  Constitutional: She is oriented to person, place, and time. She appears well-developed.  HENT:  Head: Normocephalic and atraumatic.  Cardiovascular: Normal rate and regular rhythm.   Respiratory: Effort normal and breath sounds normal. No respiratory distress.  GI: Soft. Bowel sounds are normal. There is no tenderness.  Musculoskeletal: Normal range of motion.  Neurological: She is alert and oriented to person, place, and time.  Skin: Skin is warm and dry.  Psychiatric: She has a normal mood and affect. Her behavior is normal.    Prenatal labs: ABO, Rh: B/Positive/-- (05/05 0000) Antibody: Negative (05/05 0000) Rubella: Immune (05/05 0000) RPR: Nonreactive (05/05 0000)  HBsAg: Negative (05/05 0000)  HIV: Non-reactive (05/05 0000)  GBS: Negative (07/24 0000)   Hgb 12.2/Plt 260K/ Ur Cx neg/ CF neg in FOB, mother positive/glucola 9  Pregnancy dated by 23 wk [place] Nl anat, post plac, female  Assessment/Plan: 27yo G3P1011 at 53+ for IOL given term and favorable  cervix IV pain meds/epidural prn Pitocin and AROM to augment/induce Expect SVD    [name],[name] 03/29/2013, 8:02 AM

## 2013-03-29 NOTE — Progress Notes (Signed)
Patient ID: [name], female   DOB: [birth year], 27 y.o.   MRN: [id number]  Feeling pressure otherwise comfortable with epidural.    AF VSS gen NAD  FHTs 145 category 1, some variable decels toco Q 2 min  SVE 10/100/+2-3  Will start pushing soon, awaiting husband's return

## 2013-03-29 NOTE — Anesthesia Preprocedure Evaluation (Signed)
Anesthesia Evaluation  Patient identified by MRN, date of birth, ID band Patient awake    Reviewed: Allergy & Precautions, H&P , NPO status , Patient's Chart, lab work & pertinent test results  Airway Mallampati: I TM Distance: >3 FB Neck ROM: full    Dental no notable dental hx.    Pulmonary neg pulmonary ROS,    Pulmonary exam normal       Cardiovascular negative cardio ROS      Neuro/Psych negative psych ROS   GI/Hepatic negative GI ROS, Neg liver ROS,   Endo/Other  negative endocrine ROS  Renal/GU negative Renal ROS  negative genitourinary   Musculoskeletal negative musculoskeletal ROS (+)   Abdominal Normal abdominal exam  (+)   Peds  Hematology negative hematology ROS (+)   Anesthesia Other Findings   Reproductive/Obstetrics (+) Pregnancy                           Anesthesia Physical Anesthesia Plan  ASA: II  Anesthesia Plan: Epidural   Post-op Pain Management:    Induction:   Airway Management Planned:   Additional Equipment:   Intra-op Plan:   Post-operative Plan:   Informed Consent: I have reviewed the patients History and Physical, chart, labs and discussed the procedure including the risks, benefits and alternatives for the proposed anesthesia with the patient or authorized representative who has indicated his/her understanding and acceptance.     Plan Discussed with:   Anesthesia Plan Comments:         Anesthesia Quick Evaluation

## 2013-03-29 NOTE — Progress Notes (Signed)
Patient ID: [name], female   DOB: 04/09/1986, 27 y.o.   MRN: [id number]  Reviewed H&P G1 adopted out!  Membranes stripped in office yesterday.  Was 1 cm, now 3 cm  AROM w/o diff/comp - small clear fluid  SVE 3.4/80/0  Expect SVD

## 2013-03-29 NOTE — Progress Notes (Signed)
Patient ID: [name], female   DOB: 04-Feb-1986, 27 y.o.   MRN: [id number]  Comfortable with epidural  AFVSS FHTs category 1 toco q SVE 7cm/90/0  IUPC placed w/o diff/comp  Expect SVD

## 2013-03-30 LAB — CBC
HCT: 36.7 % (ref 36.0–46.0)
Hemoglobin: 12.8 g/dL (ref 12.0–15.0)
MCH: 30.3 pg (ref 26.0–34.0)
MCHC: 34.9 g/dL (ref 30.0–36.0)
MCV: 87 fL (ref 78.0–100.0)
Platelets: 190 10*3/uL (ref 150–400)
RBC: 4.22 MIL/uL (ref 3.87–5.11)
RDW: 14 % (ref 11.5–15.5)
WBC: 21.7 10*3/uL — ABNORMAL HIGH (ref 4.0–10.5)

## 2013-03-30 MED ORDER — SENNOSIDES-DOCUSATE SODIUM 8.6-50 MG PO TABS
2.0000 | ORAL_TABLET | Freq: Every day | ORAL | Status: DC
Start: 2013-03-30 — End: 2013-03-31
  Administered 2013-03-30: 2 via ORAL

## 2013-03-30 MED ORDER — TETANUS-DIPHTH-ACELL PERTUSSIS 5-2.5-18.5 LF-MCG/0.5 IM SUSP: 0.5000 mL | Freq: Once | INTRAMUSCULAR | Status: DC

## 2013-03-30 MED ORDER — WITCH HAZEL-GLYCERIN EX PADS: 1.0000 "application " | MEDICATED_PAD | CUTANEOUS | Status: DC | PRN

## 2013-03-30 MED ORDER — LANOLIN HYDROUS EX OINT: TOPICAL_OINTMENT | CUTANEOUS | Status: DC | PRN

## 2013-03-30 MED ORDER — DIPHENHYDRAMINE HCL 25 MG PO CAPS: 25.0000 mg | ORAL_CAPSULE | Freq: Four times a day (QID) | ORAL | Status: DC | PRN

## 2013-03-30 MED ORDER — PRENATAL MULTIVITAMIN CH
1.0000 | ORAL_TABLET | Freq: Every day | ORAL | Status: DC
  Administered 2013-03-30: 1 via ORAL
  Filled 2013-03-30: qty 1

## 2013-03-30 MED ORDER — OXYCODONE-ACETAMINOPHEN 5-325 MG PO TABS
1.0000 | ORAL_TABLET | ORAL | Status: DC | PRN
  Administered 2013-03-30: 1 via ORAL
  Administered 2013-03-30: 2 via ORAL
  Administered 2013-03-30 – 2013-03-31 (×5): 1 via ORAL
  Administered 2013-03-31: 2 via ORAL
  Administered 2013-03-31: 1 via ORAL
  Filled 2013-03-30: qty 2
  Filled 2013-03-30 (×2): qty 1
  Filled 2013-03-30 (×2): qty 2
  Filled 2013-03-30 (×3): qty 1

## 2013-03-30 MED ORDER — LACTATED RINGERS IV SOLN: INTRAVENOUS | Status: DC

## 2013-03-30 MED ORDER — DIBUCAINE 1 % RE OINT: 1.0000 "application " | TOPICAL_OINTMENT | RECTAL | Status: DC | PRN

## 2013-03-30 MED ORDER — IBUPROFEN 600 MG PO TABS
600.0000 mg | ORAL_TABLET | Freq: Four times a day (QID) | ORAL | Status: DC
  Administered 2013-03-30 – 2013-03-31 (×6): 600 mg via ORAL
  Filled 2013-03-30 (×7): qty 1

## 2013-03-30 MED ORDER — ONDANSETRON HCL 4 MG/2ML IJ SOLN: 4.0000 mg | INTRAMUSCULAR | Status: DC | PRN

## 2013-03-30 MED ORDER — ZOLPIDEM TARTRATE 5 MG PO TABS: 5.0000 mg | ORAL_TABLET | Freq: Every evening | ORAL | Status: DC | PRN

## 2013-03-30 MED ORDER — SIMETHICONE 80 MG PO CHEW: 80.0000 mg | CHEWABLE_TABLET | ORAL | Status: DC | PRN

## 2013-03-30 MED ORDER — MEDROXYPROGESTERONE ACETATE 150 MG/ML IM SUSP: 150.0000 mg | Freq: Once | INTRAMUSCULAR | Status: DC

## 2013-03-30 MED ORDER — BENZOCAINE-MENTHOL 20-0.5 % EX AERO
1.0000 "application " | INHALATION_SPRAY | CUTANEOUS | Status: DC | PRN
  Administered 2013-03-30: 1 via TOPICAL
  Filled 2013-03-30: qty 56

## 2013-03-30 MED ORDER — ONDANSETRON HCL 4 MG PO TABS: 4.0000 mg | ORAL_TABLET | ORAL | Status: DC | PRN

## 2013-03-30 NOTE — Progress Notes (Signed)
Post Partum Day 1 Subjective: no complaints, up ad lib, voiding, tolerating PO and nl lochia, pain controlled  Objective: Blood pressure 101/70, pulse 90, temperature 98 F (36.7 C), temperature source Oral, resp. rate 18, height 5\' 1"  (1.549 m), weight 63.504 kg (140 lb), last menstrual period 09/26/2012, unknown if currently breastfeeding.  Physical Exam:  General: alert and no distress Lochia: appropriate Uterine Fundus: firm  Recent Labs  03/29/13 0640 03/30/13 0555  HGB 12.8 12.8  HCT 36.7 36.7    Assessment/Plan: Plan for discharge tomorrow, Breastfeeding and Lactation consult.  Routine PP care.     LOS: 1 day   [name],Vallarie Fei 03/30/2013, 7:41 AM

## 2013-03-30 NOTE — Anesthesia Postprocedure Evaluation (Signed)
  Anesthesia Post-op Note  Patient: [name]  Procedure(s) Performed: * No procedures listed *  Patient Location: PACU and Mother/Baby  Anesthesia Type:Epidural  Level of Consciousness: awake, alert  and oriented  Airway and Oxygen Therapy: Patient Spontanous Breathing  Post-op Pain: mild  Post-op Assessment: Patient's Cardiovascular Status Stable, Respiratory Function Stable, No signs of Nausea or vomiting, Adequate PO intake, Pain level controlled, No headache, No backache, No residual numbness and No residual motor weakness  Post-op Vital Signs: stable  Complications: No apparent anesthesia complications

## 2013-03-31 MED ORDER — OXYCODONE-ACETAMINOPHEN 5-325 MG PO TABS: 1.0000 | ORAL_TABLET | Freq: Four times a day (QID) | ORAL | Status: DC | PRN

## 2013-03-31 MED ORDER — PRENATAL MULTIVITAMIN CH: 1.0000 | ORAL_TABLET | Freq: Every day | ORAL | Status: DC

## 2013-03-31 MED ORDER — IBUPROFEN 800 MG PO TABS: 800.0000 mg | ORAL_TABLET | Freq: Three times a day (TID) | ORAL | Status: DC | PRN

## 2013-03-31 NOTE — Discharge Summary (Signed)
Obstetric Discharge Summary Reason for Admission: induction of labor Prenatal Procedures: none Intrapartum Procedures: spontaneous vaginal delivery Postpartum Procedures: none Complications-Operative and Postpartum: labial laceration Hemoglobin  Date Value Range Status  03/30/2013 12.8  12.0 - 15.0 g/dL Final  7/82/9562 13.0  12.2 - 16.2 g/dL Final     HCT  Date Value Range Status  03/30/2013 36.7  36.0 - 46.0 % Final     HCT, POC  Date Value Range Status  10/24/2012 37.6* 37.7 - 47.9 % Final    Physical Exam:  General: alert and no distress Lochia: appropriate Uterine Fundus: firm  Discharge Diagnoses: Term Pregnancy-delivered  Discharge Information: Date: 03/31/2013 Activity: pelvic rest Diet: routine Medications: PNV, Ibuprofen and Percocet Condition: stable Instructions: refer to practice specific booklet Discharge to: home Follow-up Information   Follow up with [name],Katrina Mudry, MD. Schedule an appointment as soon as possible for a visit in 6 weeks.   Specialty:  Obstetrics and Gynecology   Contact information:   [address] [place] [address] [phone]       Newborn Data: Live born female  Birth Weight: 7 lb 5 oz (3317 g) APGAR: 9, 9  Home with mother.  [name],[name] 03/31/2013, 8:20 AM

## 2013-03-31 NOTE — Progress Notes (Signed)
Post Partum Day 2 Subjective: no complaints, up ad lib, voiding and + flatus.  Nl lochia, pain controlled  Objective: Blood pressure 102/66, pulse 65, temperature 97.4 F (36.3 C), temperature source Oral, resp. rate 19, height 5\' 1"  (1.549 m), weight 63.504 kg (140 lb), last menstrual period 09/26/2012, SpO2 100.00%, unknown if currently breastfeeding.  Physical Exam:  General: alert and no distress Lochia: appropriate Uterine Fundus: firm   Recent Labs  03/29/13 0640 03/30/13 0555  HGB 12.8 12.8  HCT 36.7 36.7    Assessment/Plan: Discharge home, Breastfeeding and Lactation consult.  D/c home with motrin, percocet, pnv.  F/u 6 wks.     LOS: 2 days   [name],Tahjai Schetter 03/31/2013, 8:11 AM

## 2013-10-03 ENCOUNTER — Ambulatory Visit (INDEPENDENT_AMBULATORY_CARE_PROVIDER_SITE_OTHER): Payer: 59 | Admitting: Internal Medicine

## 2013-10-03 VITALS — BP 118/82 | HR 86 | Temp 97.7°F | Resp 16 | Ht 61.0 in | Wt 132.0 lb

## 2013-10-03 DIAGNOSIS — L02413 Cutaneous abscess of right upper limb: Secondary | ICD-10-CM

## 2013-10-03 DIAGNOSIS — IMO0002 Reserved for concepts with insufficient information to code with codable children: Secondary | ICD-10-CM

## 2013-10-03 DIAGNOSIS — M79609 Pain in unspecified limb: Secondary | ICD-10-CM

## 2013-10-03 MED ORDER — DOXYCYCLINE HYCLATE 100 MG PO TABS: 100.0000 mg | ORAL_TABLET | Freq: Two times a day (BID) | ORAL | Status: DC

## 2013-10-03 MED ORDER — HYDROCODONE-ACETAMINOPHEN 5-325 MG PO TABS: 1.0000 | ORAL_TABLET | Freq: Four times a day (QID) | ORAL | Status: DC | PRN

## 2013-10-03 NOTE — Progress Notes (Signed)
This chart was scribed for Ethelda Chick, MD by Luisa Dago, ED Scribe. This patient was seen in room 2 and the patient's care was started at 9:53 PM. Subjective:    Patient ID: [name], female    DOB: Dec 15, 1985, 28 y.o.   MRN: [id number] Chief Complaint  Patient presents with  . Insect Bite    4 days, right forearm, bump getting bigger and spreading, pain radiating up arm    HPI HPI Comments: [name] is a 28 y.o. female who presents to [facility] complaining of a possible insect bite that occurred 4 days ago. Pt  States that on Thursday last week she noticed a bump on her right forearm. But as the week progressed the area became more red and swollen. Right now she is complaining of associated pain. She states that she is unable to carry anything in that arm secondary to pain. Pt states that her husband does have staph, she has been with him for 5 years now. She denies any other pertinent medical history.    Patient Active Problem List   Diagnosis Date Noted  . Late prenatal care 03/29/2013  . SVD (spontaneous vaginal delivery) 03/29/2013   Past Medical History  Diagnosis Date  . No pertinent past medical history   . [id number](478.2)    Past Surgical History  Procedure Laterality Date  . No past surgeries    . Wisdom tooth extraction     No Known Allergies Prior to Admission medications   Medication Sig Start Date End Date Taking? Authorizing Provider  acetaminophen (TYLENOL) 500 MG tablet Take 500 mg by mouth every 6 (six) hours as needed for pain.    Historical Provider, MD   History   Social History  . Marital Status: Single    Spouse Name: N/A    Number of Children: N/A  . Years of Education: N/A   Occupational History  . Not on file.   Social History Main Topics  . Smoking status: Never Smoker   . Smokeless tobacco: Never Used  . Alcohol Use: No  . Drug Use: No  . Sexual Activity: Yes    Birth Control/ Protection: None   Other Topics Concern    . Not on file   Social History Narrative  . No narrative on file     Review of Systems  Constitutional: Negative for fever, chills and diaphoresis.  HENT: Negative for congestion.   Respiratory: Negative for cough and shortness of breath.   Cardiovascular: Negative for leg swelling.  Musculoskeletal: Positive for arthralgias (arm pain).       Objective:   Physical Exam  Nursing note and vitals reviewed. Constitutional: She appears well-developed and well-nourished. No distress.  HENT:  Head: Normocephalic and atraumatic.  Eyes: Conjunctivae are normal. Right eye exhibits no discharge. Left eye exhibits no discharge.  Neck: Neck supple.  Cardiovascular: Normal rate, regular rhythm and normal heart sounds.  Exam reveals no gallop and no friction rub.   No murmur heard. Pulmonary/Chest: Effort normal and breath sounds normal. No respiratory distress.  Abdominal: Soft. She exhibits no distension. There is no tenderness.  Musculoskeletal: She exhibits no edema and no tenderness.  Neurological: She is alert.  Skin: Skin is warm and dry.  There is a 2 cm x 2 cm red indurated tender area on the volar aspect of the right forearm with a central small scab.   Psychiatric: She has a normal mood and affect. Her behavior is normal. Thought content normal.  Filed Vitals:   10/03/13 2123  BP: 118/82  Pulse: 86  Temp: 97.7 F (36.5 C)  Resp: 16  Height: 5\' 1"  (1.549 m)  Weight: 132 lb (59.875 kg)  SpO2: 98%    Assessment & Plan:  Abscess of arm, right - Plan: HYDROcodone-acetaminophen (NORCO/VICODIN) 5-325 MG per tablet  Meds ordered this encounter  Medications  . HYDROcodone-acetaminophen (NORCO/VICODIN) 5-325 MG per tablet    Sig: Take 1 tablet by mouth every 6 (six) hours as needed for moderate pain.    Dispense:  15 tablet    Refill:  0    Order Specific Question:  Supervising Provider    Answer:  Ethelda ChickSMITH, KRISTI M [[id number]]  . doxycycline (VIBRA-TABS) 100 MG tablet     Sig: Take 1 tablet (100 mg total) by mouth 2 (two) times daily.    Dispense:  20 tablet    Refill:  0    Order Specific Question:  Supervising Provider    Answer:  Ethelda ChickSMITH, KRISTI M [[id number]]   Follow up as directed  I have completed the patient encounter in its entirety as documented by the scribe, with editing by me where necessary. Jazier Mcglamery P. Merla Richesoolittle, M.D.

## 2013-10-03 NOTE — Progress Notes (Signed)
   Patient ID: Alphia KavaJessica See MRN: [id number], DOB: August 01, 1986, 28 y.o. Date of Encounter: 10/03/2013, 10:25 PM    PROCEDURE NOTE: Verbal consent obtained. Risks and benefits of the procedure were explained to the patient. Patient made an informed decision to proceed with the procedure. Betadine prep per usual protocol. Local anesthesia obtained with 1% lidocaine with epi 2 cc.  1 cm incision made with 11 blade along lesion.  Culture taken. Moderate purulence expressed. Lesion explored revealing no loculations. Irrigated with NS Packed with 1/4 plain packing Dressed. Wound care instructions including precautions with patient. Patient tolerated the procedure well. Recheck in 48 hours.     Signed, Eula Listenyan Ixchel Duck, MHS, PA-C [facility] [place], [place] [id number] [phone] [facility] 10/03/2013 10:25 PM

## 2013-10-05 ENCOUNTER — Ambulatory Visit (INDEPENDENT_AMBULATORY_CARE_PROVIDER_SITE_OTHER): Payer: 59 | Admitting: Physician Assistant

## 2013-10-05 VITALS — BP 102/80 | HR 93 | Temp 98.1°F | Resp 16 | Ht 60.5 in

## 2013-10-05 DIAGNOSIS — IMO0002 Reserved for concepts with insufficient information to code with codable children: Secondary | ICD-10-CM

## 2013-10-05 MED ORDER — SULFAMETHOXAZOLE-TRIMETHOPRIM 800-160 MG PO TABS: 1.0000 | ORAL_TABLET | Freq: Two times a day (BID) | ORAL | Status: DC

## 2013-10-06 ENCOUNTER — Ambulatory Visit (INDEPENDENT_AMBULATORY_CARE_PROVIDER_SITE_OTHER): Payer: 59 | Admitting: Physician Assistant

## 2013-10-06 VITALS — BP 100/76 | HR 97 | Temp 98.2°F | Resp 16 | Ht 61.5 in

## 2013-10-06 DIAGNOSIS — IMO0002 Reserved for concepts with insufficient information to code with codable children: Secondary | ICD-10-CM

## 2013-10-06 DIAGNOSIS — L259 Unspecified contact dermatitis, unspecified cause: Secondary | ICD-10-CM

## 2013-10-06 DIAGNOSIS — L02413 Cutaneous abscess of right upper limb: Secondary | ICD-10-CM

## 2013-10-06 MED ORDER — HYDROCODONE-ACETAMINOPHEN 5-325 MG PO TABS: 1.0000 | ORAL_TABLET | Freq: Four times a day (QID) | ORAL | Status: DC | PRN

## 2013-10-06 MED ORDER — FLUOCINONIDE 0.05 % EX CREA: 1.0000 "application " | TOPICAL_CREAM | Freq: Two times a day (BID) | CUTANEOUS | Status: DC

## 2013-10-06 NOTE — Progress Notes (Signed)
   Subjective:    Patient ID: [name], female    DOB: 12-01-1985, 28 y.o.   MRN: [id number]  Wound Check     Ms. [name] is a very pleasant 28 yr old female here for follow up on an I&D performed 3 days ago.  She reports the arm continues to be painful.  She has developed redness, itching, skin irritation around the wound.  She has never had anything like this before.  She is concerned for spreading infection.  Yesterday she was changed from doxy to bactrim and is tolerating that better.  She continues using Norco for pain though she is almost out    Review of Systems  Constitutional: Negative for fever and chills.  Eyes: Positive for discharge.  Respiratory: Negative.   Cardiovascular: Negative.   Gastrointestinal: Negative.   Skin: Positive for wound.       Objective:   Physical Exam  Vitals reviewed. Constitutional: She is oriented to person, place, and time. She appears well-developed and well-nourished. No distress.  HENT:  Head: Normocephalic and atraumatic.  Eyes: Conjunctivae are normal. No scleral icterus.  Pulmonary/Chest: Effort normal.  Neurological: She is alert and oriented to person, place, and time.  Skin: Skin is warm and dry.  Wound is improved from yesterday - less erythema, induration; erythematous skin in outline of tape from previous dressings - appears c/w contact dermatitis  Psychiatric: She has a normal mood and affect. Her behavior is normal.    Wound Care: 0.5cc 2% plain lidocaine injected into wound edges.  Gently removed small amount of necrotic tissue.  No further purulence drained.  Wound bed healthy.  Not repacked.      Assessment & Plan:  Abscess of arm, right - Plan: HYDROcodone-acetaminophen (NORCO/VICODIN) 5-325 MG per tablet  Contact dermatitis - Plan: fluocinonide cream (LIDEX) 0.05 %   Ms. [name] is a very pleasant 28 yr old female here for recheck of I&D.  Abscess appears to be healing well.  Will continue bactrim.  Continue  daily dressing changes.  She appears to have a contact dermatitis secondary to tape use.  Will use fluocinonide BID to affected areas.  Cautioned pt not to apply to wound.  Re dressed area with cling and coban.  Discouraged pt from using tape.  Recheck Saturday or Sunday with Alycia Rossettiyan, sooner if concerns    E. Frances FurbishElizabeth Hartlyn Reigel MHS, PA-C [facility] 3/5/201511:15 AM

## 2013-10-06 NOTE — Progress Notes (Signed)
   Subjective:    Patient ID: [name], female    DOB: 1985/11/12, 28 y.o.   MRN: [id number]  HPI   Ms. [name] is a very pleasant 28 yr old female here for wound care following I&D of an abscess on her forearm.  She states she is doing well.  Still with some pain, but much less than previously.  She has not changed the dressing.  She has had significant nausea with the doxy - wonders if this can be changed.  Denies fever, chills    Review of Systems  Constitutional: Negative for fever and chills.  Gastrointestinal: Positive for nausea. Negative for vomiting.  Musculoskeletal: Negative.   Skin: Positive for wound.       Objective:   Physical Exam  Vitals reviewed. Constitutional: She is oriented to person, place, and time. She appears well-developed and well-nourished. No distress.  HENT:  Head: Normocephalic and atraumatic.  Eyes: Conjunctivae are normal. No scleral icterus.  Pulmonary/Chest: Effort normal.  Neurological: She is alert and oriented to person, place, and time.  Skin: Skin is warm and dry.     Healing abscess at right forearm; mild TTP; still erythematous and indurated; expressed a small amount of purulences; I have not repacked  Psychiatric: She has a normal mood and affect. Her behavior is normal.        Assessment & Plan:  Cellulitis and abscess of upper arm and forearm - Plan: sulfamethoxazole-trimethoprim (BACTRIM DS,SEPTRA DS) 800-160 MG per tablet   Ms. [name] is a very pleasant 28 yr old female here for wound care following I&D.  The area appears to be healing, but there is still erythema, induration, and slight warmth.  I have not repacked today, but I do want her to recheck in 48 hours to ensure resolution of cellulitis.  Pt not able to tolerate doxy.  I do not see that a culture was ordered, but will assume this is MRSA and will change to tmp/smx   [name] MHS, PA-C [facility] 3/5/20158:18 AM

## 2013-10-06 NOTE — Patient Instructions (Signed)
Continue taking the antibiotics as directed.  Tylenol or Advil for pain relief.  Use the Hydrocodone if needed  Change the dressing at least once daily  Use the fluocinonide cream twice daily to those red, itchy areas - do not put on the wound  Recheck on Saturday or Sunday with Alycia Rossettiyan

## 2013-12-21 ENCOUNTER — Ambulatory Visit (INDEPENDENT_AMBULATORY_CARE_PROVIDER_SITE_OTHER): Payer: 59 | Admitting: Family Medicine

## 2013-12-21 VITALS — BP 98/70 | HR 95 | Temp 97.3°F | Resp 20 | Ht 61.0 in | Wt 125.0 lb

## 2013-12-21 DIAGNOSIS — R059 Cough, unspecified: Secondary | ICD-10-CM

## 2013-12-21 DIAGNOSIS — R05 Cough: Secondary | ICD-10-CM

## 2013-12-21 DIAGNOSIS — J069 Acute upper respiratory infection, unspecified: Secondary | ICD-10-CM

## 2013-12-21 MED ORDER — HYDROCOD POLST-CHLORPHEN POLST 10-8 MG/5ML PO LQCR: 5.0000 mL | Freq: Every evening | ORAL | Status: DC | PRN

## 2013-12-21 NOTE — Patient Instructions (Signed)
Sudafed (generic fine)- in the morning and after lunch for congestion Delsym during the day for cough Lots of fluids- 6-8 glasses a day Ibuprofen 2-3 tablets for muscle aches, sore throat.

## 2013-12-21 NOTE — Progress Notes (Signed)
   Subjective:    Patient ID: [name], female    DOB: [birth year], 28 y.o.   MRN: [id number]  HPI Patient with 2 days of nasal congestion, cough, post nasal drainage, sore throat, ear pain/pressure.  Dayquil x 1 without significant relief.  Non-smoker. Sick co-workers. Has 789 month old daughter at home.  Review of Systems No fever, no shortness of breath, + fatigue, + muscle aches, +nausea, no vomiting.    Objective:   Physical Exam  Vitals reviewed. Constitutional: She is oriented to person, place, and time. She appears well-developed and well-nourished. No distress.  Appears to feel unwell, no distress.  HENT:  Head: Normocephalic and atraumatic.  Right Ear: External ear and ear canal normal. A middle ear effusion is present.  Left Ear: Tympanic membrane, external ear and ear canal normal.  Nose: Mucosal edema and rhinorrhea present. Right sinus exhibits no maxillary sinus tenderness and no frontal sinus tenderness. Left sinus exhibits no maxillary sinus tenderness and no frontal sinus tenderness.  Mouth/Throat: Uvula is midline and mucous membranes are normal. Posterior oropharyngeal edema and posterior oropharyngeal erythema present. No oropharyngeal exudate or tonsillar abscesses.  Eyes: Conjunctivae are normal. Right eye exhibits no discharge. Left eye exhibits no discharge. No scleral icterus.  Neck: Normal range of motion. Neck supple.  Cardiovascular: Normal rate, regular rhythm and normal heart sounds.   Pulmonary/Chest: Effort normal and breath sounds normal.  Musculoskeletal: Normal range of motion.  Lymphadenopathy:    She has cervical adenopathy.  Neurological: She is alert and oriented to person, place, and time.  Skin: Skin is warm and dry. She is not diaphoretic.  Psychiatric: She has a normal mood and affect. Her behavior is normal. Judgment and thought content normal.       Assessment & Plan:  1. Acute upper respiratory infections of unspecified  site -likely viral in nature in healthy, non-smoker -Provided written and oral instructions for symptomatic relief- Patient Instructions  Sudafed (generic fine)- in the morning and after lunch for congestion Delsym during the day for cough Lots of fluids- 6-8 glasses a day Ibuprofen 2-3 tablets for muscle aches, sore throat.   -RTC if no improvement in 3-5 days.  2. Cough Meds ordered this encounter  Medications  . chlorpheniramine-HYDROcodone (TUSSIONEX PENNKINETIC ER) 10-8 MG/5ML LQCR    Sig: Take 5 mLs by mouth at bedtime as needed for cough (cough).    Dispense:  70 mL    Refill:  0    Order Specific Question:  Supervising Provider    Answer:  Ethelda ChickSMITH, KRISTI M [[id number]]      Emi Belfasteborah B. Chrishelle Zito, FNP-BC  [facility]  12/21/2013 8:12 PM

## 2014-04-17 ENCOUNTER — Emergency Department (HOSPITAL_COMMUNITY)
Admission: EM | Admit: 2014-04-17 | Discharge: 2014-04-17 | Disposition: A | Discharge status: Home / Self Care | Payer: Medicaid Other | Attending: Emergency Medicine | Admitting: Emergency Medicine

## 2014-04-17 ENCOUNTER — Encounter (HOSPITAL_COMMUNITY): Payer: Self-pay | Admitting: Emergency Medicine

## 2014-04-17 DIAGNOSIS — R51 Headache: Secondary | ICD-10-CM | POA: Diagnosis not present

## 2014-04-17 DIAGNOSIS — K029 Dental caries, unspecified: Secondary | ICD-10-CM | POA: Diagnosis not present

## 2014-04-17 DIAGNOSIS — K0889 Other specified disorders of teeth and supporting structures: Secondary | ICD-10-CM

## 2014-04-17 DIAGNOSIS — K089 Disorder of teeth and supporting structures, unspecified: Secondary | ICD-10-CM | POA: Insufficient documentation

## 2014-04-17 MED ORDER — HYDROCODONE-ACETAMINOPHEN 5-325 MG PO TABS
1.0000 | ORAL_TABLET | Freq: Once | ORAL | Status: AC
  Administered 2014-04-17: 1 via ORAL
  Filled 2014-04-17: qty 1

## 2014-04-17 MED ORDER — AMOXICILLIN 500 MG PO CAPS: 500.0000 mg | ORAL_CAPSULE | Freq: Three times a day (TID) | ORAL | Status: DC

## 2014-04-17 MED ORDER — HYDROCODONE-ACETAMINOPHEN 5-325 MG PO TABS: 1.0000 | ORAL_TABLET | Freq: Four times a day (QID) | ORAL | Status: DC | PRN

## 2014-04-17 NOTE — ED Provider Notes (Signed)
Medical screening examination/treatment/procedure(s) were performed by non-physician practitioner and as supervising physician I was immediately available for consultation/collaboration.   EKG Interpretation None       Arby Barrette, MD 04/17/14 2111

## 2014-04-17 NOTE — Discharge Instructions (Signed)
Dental Pain A tooth ache may be caused by cavities (tooth decay). Cavities expose the nerve of the tooth to air and hot or cold temperatures. It may come from an infection or abscess (also called a boil or furuncle) around your tooth. It is also often caused by dental caries (tooth decay). This causes the pain you are having. DIAGNOSIS  Your caregiver can diagnose this problem by exam. TREATMENT   If caused by an infection, it may be treated with medications which kill germs (antibiotics) and pain medications as prescribed by your caregiver. Take medications as directed.  Only take over-the-counter or prescription medicines for pain, discomfort, or fever as directed by your caregiver.  Whether the tooth ache today is caused by infection or dental disease, you should see your dentist as soon as possible for further care. SEEK MEDICAL CARE IF: The exam and treatment you received today has been provided on an emergency basis only. This is not a substitute for complete medical or dental care. If your problem worsens or new problems (symptoms) appear, and you are unable to meet with your dentist, call or return to this location. SEEK IMMEDIATE MEDICAL CARE IF:   You have a fever.  You develop redness and swelling of your face, jaw, or neck.  You are unable to open your mouth.  You have severe pain uncontrolled by pain medicine. MAKE SURE YOU:   Understand these instructions.  Will watch your condition.  Will get help right away if you are not doing well or get worse. Document Released: 07/21/2005 Document Revised: 10/13/2011 Document Reviewed: 03/08/2008 [facility] Patient Information 2015 [place], [place]. This information is not intended to replace advice given to you by your health care provider. Make sure you discuss any questions you have with your health care provider.  Dental Caries Dental caries is tooth decay. This decay can cause a hole in teeth (cavity) that can get bigger and  deeper over time. HOME CARE  Brush and floss your teeth. Do this at least two times a day.  Use a fluoride toothpaste.  Use a mouth rinse if told by your dentist or doctor.  Eat less sugary and starchy foods. Drink less sugary drinks.  Avoid snacking often on sugary and starchy foods. Avoid sipping often on sugary drinks.  Keep regular checkups and cleanings with your dentist.  Use fluoride supplements if told by your dentist or doctor.  Allow fluoride to be applied to teeth if told by your dentist or doctor. Document Released: 04/29/2008 Document Revised: 12/05/2013 Document Reviewed: 07/23/2012 [facility] Patient Information 2015 [place], [place]. This information is not intended to replace advice given to you by your health care provider. Make sure you discuss any questions you have with your health care provider.   Dental Extraction A dental extraction procedure refers to a routine tooth extraction performed by your dentist. The procedure depends on where and how the tooth is positioned. The procedure can be very quick, sometimes lasting only seconds. Reasons for dental extraction include:  Tooth decay.  Infections (abscesses).  The need to make room for other teeth.  Gum diseases where the supporting bone has been destroyed.  Fractures of the tooth leaving it unrestorable.  Extra teeth (supernumerary) or grossly malformed teeth.  Baby teeththat have not fallen out in time and have not permitted the the permanent teeth to erupt properly.  In preparation for braces where there is not enough room to align the teeth properly.  Not enough room for wisdom teeth (  particularly those that are impacted).  Prior to receiving radiation to the head and neck,teeth in the field of radiation may need to be extracted. LET YOUR CAREGIVER KNOW ABOUT:  Any allergies.  All medicines you are taking:  Including herbs, eye drops, over-the-counter medications, and creams.  Blood  thinners (anticoagulants), aspirin, or other drugs that may affect blood clotting.  Use of steroids (through mouth or as creams).  Previous problems with anesthetics, including local anesthetics.  History of bleeding or blood problems.  Previous surgery.  Possibility of pregnancy if this applies.  Smoking history.  Any health problems. RISKS AND COMPLICATIONS As with any procedure, complications may occur, but they can usually be managed by your caregiver. General surgical complications may include:  Reaction to anesthesia.  Damage to surrounding teeth, nerves, tissues, or structures.  Infection.  Bleeding. With appropriate treatment and care after surgery, the following complications are very uncommon:  Dry socket (blood clot does not form or stay in place over empty socket). This can delay healing.  Incomplete extraction of roots.  Jawbone injury, pain, or weakness. BEFORE THE PROCEDURE  Your dental care provider will:  Take a medical and dental history.  Take an X-ray to evaluate the circumstances and how to best extract the tooth.  Do an oral exam.  Depending on the situation, antibiotics may be given before or after the extraction.  Your caregivers may review the procedure, the local anesthesia and/or sedation being used, and what to expect after the procedure with you.  If needed, your dentist may give you a form of sedation, either by medicine you swallow, gas, or intravenously (IV). This will help to relieve anxiety. Complicated extractions may require the use of general anesthesia. It is important to follow your caregiver's instructions prior to your procedure to avoid complications. Steps before your procedure may include:  Alert your caregiver if you feel ill (sore throat, fever, upset stomach, etc.) in the days leading up to your procedure.  Stop taking certain medications for several days prior to your procedure such as blood thinners.  Take  certain medications, such as antibiotics.  Avoid eating and drinking for several hours before the procedure. This will help you to avoid complications from the sedation or anesthesia.  Sign a patient consent form.  Have a friend or family member drive you to the dentist and drive you home after the procedure.  Wear comfortable, loose clothing. Limit makeup and jewelry.  Quit smoking. If you are a smoker, this will raise the chances of a healing problem after your procedure. If you are thinking about quitting, talk to your surgeon about how long before the operation you should stop smoking. You may also get help from your primary caregiver. PROCEDURE Dental extraction is typically done as an outpatient procedure. IV sedation, local anesthesia, or both may be used. It will keep you comfortable and free of pain during the procedure.  There are 2 types of extractions:  Simple extraction involves a tooth that is visible in the mouth and above the gum line. After local anesthetic is given by injection, and the area is numbed, the dentist will loosen the tooth with a special instrument (elevator). Then another instrument (forceps) will be used to grasp the tooth and remove it from its socket. During the procedure you will feel some pressure, but you should not feel pain. If you do feel pain, tell your dentist. The open socket will be cleaned. Dressings (gauze) will be placed in the socket  to reduce bleeding.  Surgical extractions are used if the tooth has not come into the mouth or the tooth is broken off below the gum line. The dentist will make a cut (incision) in the gum and may have to remove some of the bone around the tooth to aid in the removal of the tooth. After removal, stitches (sutures) may be required to close the area to help in healing and control bleeding. For some surgical extractions, you may need a general anesthetic or IV sedation (through the vein). After both types of extractions,  you may be given pain medication or other drugs to help healing. Other postoperative instructions will be given by your dental caregiver. AFTER THE PROCEDURE  You will have gauze in your mouth where the tooth was removed. Gentle pressure on the gauze for up to 1 hour will help to control bleeding.  A blood clot will begin to form over the open socket. This is normal. Do not touch the area or rinse it.  Your pain will be controlled with medication and self-care.  You will be given detailed instructions for care after surgery. PROGNOSIS While some discomfort is normal after tooth extraction, most patients recover fully in just a few days. SEEK IMMEDIATE DENTAL CARE  You have uncontrolled bleeding, marked swelling, or severe pain.  You develop a fever, difficulty swallowing, or other severe symptoms.  You have questions or concerns. Document Released: 07/21/2005 Document Revised: 12/05/2013 Document Reviewed: 10/25/2010 [facility] Patient Information 2015 [place], [place]. This information is not intended to replace advice given to you by your health care provider. Make sure you discuss any questions you have with your health care provider.   Emergency Department Resource Guide 1) Find a Doctor and Pay Out of Pocket Although you won't have to find out who is covered by your insurance plan, it is a good idea to ask around and get recommendations. You will then need to call the office and see if the doctor you have chosen will accept you as a new patient and what types of options they offer for patients who are self-pay. Some doctors offer discounts or will set up payment plans for their patients who do not have insurance, but you will need to ask so you aren't surprised when you get to your appointment.  2) Contact Your Local Health Department Not all health departments have doctors that can see patients for sick visits, but many do, so it is worth a call to see if yours does. If you don't know  where your local health department is, you can check in your phone book. The CDC also has a tool to help you locate your state's health department, and many state websites also have listings of all of their local health departments.  3) Find a Walk-in Clinic If your illness is not likely to be very severe or complicated, you may want to try a walk in clinic. These are popping up all over the country in pharmacies, drugstores, and shopping centers. They're usually staffed by nurse practitioners or physician assistants that have been trained to treat common illnesses and complaints. They're usually fairly quick and inexpensive. However, if you have serious medical issues or chronic medical problems, these are probably not your best option.  No Primary Care Doctor: - Call Health Connect at  [phone] - they can help you locate a primary care doctor that  accepts your insurance, provides certain services, etc. - Physician Referral Service- [phone]  Chronic Pain Problems:  Organization         Address  Phone   Notes  [facility]  [phone] Patients need to be referred by their primary care doctor.   Medication Assistance: Organization         Address  Phone   Notes  [facility] Medication [facility] [address] [place] [place], [place] [address] [phone] --Must be a resident of [facility] -- Must have NO insurance coverage whatsoever (no Medicaid/ Medicare, etc.) -- The pt. MUST have a primary care doctor that directs their care regularly and follows them in the community   [facility]ist  [phone]   [employer]  [phone]    Agencies that provide inexpensive medical care: Organization         Address  Phone   Notes  [facility]  [phone]   [facility]    [phone]   [facility] [address] [place], [place] [address] [phone]   [facility]  [address], [place] [phone]   Planned Parenthood    [phone]   [facility]    [phone]   [facility]  [address], [place] Phone:  [phone], Fax:  [phone] Hours of Operation:  9 am - 6 pm, M-F.  Also accepts Medicaid/Medicare and self-pay.  [facility]  [address], [place] Phone: [phone], Fax: [phone]. Hours of Operation:  8:30 am - 5:30 pm, M-F.  Also accepts Medicaid and self-pay.  [facility] [place] [address], [place] Phone: [phone]   [facility] [address] Natasha Bence [place], [place] [phone], Ext. [phone] Mondays & Thursdays: 7-9 AM.  First 15 patients are seen on a first come, first serve basis.    Medicaid-accepting [facility] Providers:  Organization         Address  Phone   Notes  [facility] [address], [place] [phone] Also accepts self-pay patients.  [facility] [address] Laurell Josephs [place], [place]  [phone]   [facility] [address], [place] [phone]   [facility] [address] [place] [phone]   Renaye Rakers [address], [place]   [phone] Only accepts [place] Access [place] patients after they have their name applied to their card.   Self-Pay (no insurance) in [facility]:  Organization         Address  Phone   Notes  Sickle Cell Patients, [facility] [address] [place], [place] [phone]   [facility] [address] [place], [place] [phone]   [facility] [place]  [address], [place] [phone]   [facility]/Dr. Osei-Bonsu  [address] [place] or [address], [place] [phone] Phone number for both  [place] and [place] locations is the same.  [facility] [address] [place] [phone]   [facility] [address] [place] or [address] [phone]   [facility] [address], [place] [phone], phone; [phone], fax Sees patients 1st and  3rd Saturday of every month.  Must not qualify for public or private insurance (i.e. Medicaid, Medicare, [place] Health Choice, Veterans' Benefits)  Household income should be no more than 200% of the poverty level The clinic cannot treat you if you are pregnant or think you are pregnant  Sexually transmitted diseases are not treated at the clinic.    Dental Care: Organization         Address  Phone  Notes  [facility] Department of [facility] [place] [address] [place], [place] [phone] Accepts children up to age 71 who are enrolled in [place] or [place] Health Choice; pregnant women with a Medicaid card; and children who have applied for Medicaid or [place] Health Choice, but were declined, whose parents can pay a reduced fee at time of service.  [facility]  [address], [place] [phone] Accepts children up to age 71 who are enrolled in [place] or [place] Health Choice; pregnant women with a Medicaid card; and children who have applied for Medicaid or [place] Health Choice, but were declined, whose parents can pay a reduced fee at time of service.  [facility]  [address] [place], [place] [phone] Patients are seen by appointment only. Walk-ins are not accepted. [facility] will see patients 35 years of age and older. Monday - Tuesday (8am-5pm) Most Wednesdays (8:30-5pm) $30 per visit, cash only  [facility]  [address], [facility] [phone] Patients are seen by appointment only. Walk-ins are not  accepted. [facility] will see patients 68 years of age and older. One Wednesday Evening (Monthly: Volunteer Based).  $30 per visit, cash only  [employer]  [phone] for adults; Children under age 73, call Graduate Pediatric Dentistry at [phone]. Children aged 40-14, please call [phone] to request a pediatric application.  Dental services are provided in all areas of dental care including fillings, crowns and bridges, complete and partial dentures, implants, gum treatment, root canals, and extractions. Preventive care is also provided. Treatment is provided to both adults and children. Patients are selected via a lottery and there is often a waiting list.   [facility] [address], [place]  [phone] [email or web address]   [facility] [address] [place], [place] [phone], Ext. [phone] Second and Fourth Thursday of each month, opens at 6:30 AM; Clinic ends at 9 AM.  Patients are seen on a first-come first-served basis, and a limited number are seen during each clinic.   [facility]  [address] Ether [place]ins [place], [place] [phone]   Eligibility Requirements You must have lived in [place], [place], or [place] counties for at least the last three months.   You cannot be eligible for state or federal sponsored [place], including [employer], [place], or [employer].   You generally cannot be eligible for healthcare insurance through your employer.    How to apply: Eligibility screenings are held every Tuesday and Wednesday afternoon from 1:00 pm until 4:00 pm. You do not need an appointment for the interview!  [facility] [address] [place], [place] [phone]   [facility] Health Department  [phone]   [facility] Health Department  [phone]   [facility] Health Department  [phone]    Behavioral Health Resources in the  Community: Intensive Outpatient Programs Organization  Address  Phone  Notes  [place] [address], [place], [place] [phone]   [facility] [address], [place], [place]   ADS: [facility] Drug [facility] [address] [place], [place]   [place] [address]  [place], [place] or [phone]   Substance Abuse Resources Organization         Address  Phone  Notes  Alcohol [facility] Drug Services  [phone]   [place]  [phone]   [facility] [place]   Chinita Pester  [phone]   Residential & Outpatient Substance Abuse Program  [phone]   Psychological Services Organization         Address  Phone  Notes  [facility]  [place]  [phone]   [place] [address], [place] or [phone]    Mobile Crisis Teams Organization         Address  Phone  Notes  [facility], Mobile Crisis Care Unit  [phone]   [facility]  [address] [place], [place]   [place] [address] [place] [phone]    Self-Help/Support Groups Organization         Address  Phone             Notes  [place] [facility] [place] - variety of support groups  [place] Call for more information  Narcotics Anonymous (NA), [facility] [address], [employer] [place]  2 meetings at this location   Special educational needs teacher         Address  Phone  Notes  ASAP Residential Treatment [place],    [place]  [phone]   [facility]  [address] [place] [address] [place] [id number], [place], [place]   [place], [place] [phone] Admissions: 8am-3pm M-F  Incentives Substance [place] [address],    [place], [place] [phone]   [facility] [address] [place], [place], [place]   [facility] [address],  [place], [place]   [facility] - Intensive Outpatient [address] Kristeen [address], [place], [place]   [facility] ([place]  [place], [place] [phone] or [phone]   [facility] ([facility]) [address], [place], [place] Accepts Medicaid  [facility] [place] [address]  [place] [phone] Substance Abuse/Addiction Treatment   [facility] Organization         Address  Phone  Notes  [facility]  [phone]   Domenic Schwab, PhD [address] Arlis Port[place], [place]   [phone] or [phone]   [facility], [place] [phone]   [place] [address], [place], [place] [phone] Insurance/Medicaid/sponsorship through [facility] and Families [address], [address] [place]                                    [place], [place] [phone] [place] [address], [place] [phone]    Dr. Adele Schilder  [phone]   [facility]  Felt Dept. 1) [address] [place] 2) [place] 3)  [address], [place] [phone]  [phone]   [place] [phone] or [phone] (After Hours)

## 2014-04-17 NOTE — ED Provider Notes (Signed)
CSN: [id number]     Arrival date & time 04/17/14  1641 History  This chart was scribed for non-physician practitioner, Terri Piedra, PA-C working with Arby Barrette, MD by Greggory Stallion, ED scribe. This patient was seen in room WTR6/WTR6 and the patient's care was started at 6:14 PM.   Chief Complaint  Patient presents with  . Dental Pain   The history is provided by the patient. No language interpreter was used.   HPI Comments: [name] is a 28 y.o. female who presents to the Emergency Department complaining of gradual onset right lower dental pain that started 3 days ago. States her tooth has been broken for one year and has had intermittent pain since. Pain radiates to the right side of her face. Denies any drainage from the tooth. Pt has taken tylenol and ibuprofen and used Orajel with no relief. She has not seen a dentist in a year. Pt has an oral surgery consultation in October. Denies fever, chills, facial swelling, SOB, nausea, emesis. Pt used to be a smoker but quit.   Past Medical History  Diagnosis Date  . No pertinent past medical history   . [id number](478.2)    Past Surgical History  Procedure Laterality Date  . No past surgeries    . Wisdom tooth extraction     Family History  Problem Relation Age of Onset  . Diabetes Mother   . Hypertension Mother   . Heart disease Paternal Grandfather   . Hypertension Paternal Grandfather    History  Substance Use Topics  . Smoking status: Never Smoker   . Smokeless tobacco: Never Used  . Alcohol Use: No   OB History   Grav Para Term Preterm Abortions TAB SAB Ect Mult Living   Review of Systems A complete 10 system review of systems was obtained and all systems are negative except as noted in the HPI and PMH.   Allergies  Review of patient's allergies indicates no known allergies.  Home Medications   Prior to Admission medications   Medication Sig Start Date End Date Taking? Authorizing  Provider  acetaminophen (TYLENOL) 500 MG tablet Take 1,000-1,500 mg by mouth every 4 (four) hours as needed for mild pain.   Yes Historical Provider, MD  amoxicillin (AMOXIL) 500 MG capsule Take 1 capsule (500 mg total) by mouth 3 (three) times daily. 04/17/14   Diontae Route A Forcucci, PA-C  HYDROcodone-acetaminophen (NORCO/VICODIN) 5-325 MG per tablet Take 1 tablet by mouth every 6 (six) hours as needed for moderate pain or severe pain. 04/17/14   Jearldean Gutt A Forcucci, PA-C   BP 127/81  Pulse 112  Temp(Src) 98.2 F (36.8 C) (Oral)  Resp 19  SpO2 100%  LMP 04/03/2014  Physical Exam  Nursing note and vitals reviewed. Constitutional: She is oriented to person, place, and time. She appears well-developed and well-nourished. No distress.  HENT:  Head: Normocephalic and atraumatic.  Right Ear: Tympanic membrane normal.  Left Ear: Tympanic membrane normal.  Nose: Nose normal.  Mouth/Throat: Oropharynx is clear and moist.  Dental caries throughout. Right lower wisdom tooth is fractured with visible pulp. No gingival erythema or swelling. No signs of trismus.  Eyes: Conjunctivae and EOM are normal. Pupils are equal, round, and reactive to light. Right conjunctiva is not injected. Left conjunctiva is not injected.  Neck: Neck supple.  Cardiovascular: Normal rate, regular rhythm and normal heart sounds.  Exam reveals no gallop and  no friction rub.   No murmur heard. Pulmonary/Chest: Effort normal and breath sounds normal. No respiratory distress. She has no wheezes. She has no rhonchi. She has no rales.  Musculoskeletal: Normal range of motion.  Lymphadenopathy:    She has no cervical adenopathy.       Right: No supraclavicular adenopathy present.       Left: No supraclavicular adenopathy present.  Neurological: She is alert and oriented to person, place, and time.  Skin: Skin is warm and dry.  Psychiatric: She has a normal mood and affect. Her behavior is normal.    ED Course  Procedures  (including critical care time)  DIAGNOSTIC STUDIES: Oxygen Saturation is 100% on RA, normal by my interpretation.    COORDINATION OF CARE: 6:19 PM-Pt declined dental block. Discussed treatment plan which includes an antibiotic and a short course of pain medication with pt at bedside and pt agreed to plan. Will give pt dental referrals and advised her to follow up.   Labs Review Labs Reviewed - No data to display  Imaging Review No results found.   EKG Interpretation None      MDM   Final diagnoses:  Pain, dental  Dental caries   Patient is a 28 y.o. Female who presents to the ED with dental pain.  Physical examination reveals dental caries and dental fracture likely secondary to decay.  There are no signs of trismus or ludwigs angina.  Patient to be treated with hydrocodone and amoxcillin.   Patient offered dental block but she refused.  Patient to follow-up with dentistry and oral surgeon.  Patient is stable for discharge.  Patient to return for trimus and ludwigs angina symptoms.  Patient states understanding and agreement.    I personally performed the services described in this documentation, which was scribed in my presence. The recorded information has been reviewed and is accurate.  Eben Burow, PA-C 04/17/14 1831

## 2014-04-17 NOTE — ED Notes (Signed)
Pt reports R lower wisdom tooth pain, reports broken tooth.  Pain started yesterday, today pain is worse.  Radiating to her R temporal area.  Pt also reports blurred vision.

## 2014-06-05 ENCOUNTER — Encounter (HOSPITAL_COMMUNITY): Payer: Self-pay | Admitting: Emergency Medicine

## 2014-07-22 ENCOUNTER — Encounter (HOSPITAL_COMMUNITY): Payer: Self-pay | Admitting: Emergency Medicine

## 2014-07-22 ENCOUNTER — Emergency Department (HOSPITAL_COMMUNITY)
Admission: EM | Admit: 2014-07-22 | Discharge: 2014-07-22 | Disposition: A | Discharge status: Home / Self Care | Payer: 59 | Attending: Emergency Medicine | Admitting: Emergency Medicine

## 2014-07-22 ENCOUNTER — Emergency Department (HOSPITAL_COMMUNITY): Payer: 59

## 2014-07-22 DIAGNOSIS — G8929 Other chronic pain: Secondary | ICD-10-CM

## 2014-07-22 DIAGNOSIS — S99911A Unspecified injury of right ankle, initial encounter: Secondary | ICD-10-CM | POA: Insufficient documentation

## 2014-07-22 DIAGNOSIS — Y9289 Other specified places as the place of occurrence of the external cause: Secondary | ICD-10-CM | POA: Insufficient documentation

## 2014-07-22 DIAGNOSIS — R51 Headache: Secondary | ICD-10-CM | POA: Insufficient documentation

## 2014-07-22 DIAGNOSIS — X58XXXA Exposure to other specified factors, initial encounter: Secondary | ICD-10-CM | POA: Insufficient documentation

## 2014-07-22 DIAGNOSIS — M25571 Pain in right ankle and joints of right foot: Secondary | ICD-10-CM | POA: Insufficient documentation

## 2014-07-22 DIAGNOSIS — G8921 Chronic pain due to trauma: Secondary | ICD-10-CM | POA: Insufficient documentation

## 2014-07-22 DIAGNOSIS — T1490XA Injury, unspecified, initial encounter: Secondary | ICD-10-CM

## 2014-07-22 DIAGNOSIS — M545 Low back pain: Secondary | ICD-10-CM | POA: Insufficient documentation

## 2014-07-22 DIAGNOSIS — M79671 Pain in right foot: Secondary | ICD-10-CM

## 2014-07-22 DIAGNOSIS — Y998 Other external cause status: Secondary | ICD-10-CM | POA: Insufficient documentation

## 2014-07-22 DIAGNOSIS — M5442 Lumbago with sciatica, left side: Secondary | ICD-10-CM

## 2014-07-22 DIAGNOSIS — M549 Dorsalgia, unspecified: Secondary | ICD-10-CM

## 2014-07-22 DIAGNOSIS — G8911 Acute pain due to trauma: Secondary | ICD-10-CM | POA: Insufficient documentation

## 2014-07-22 DIAGNOSIS — Y9301 Activity, walking, marching and hiking: Secondary | ICD-10-CM | POA: Insufficient documentation

## 2014-07-22 DIAGNOSIS — Z79899 Other long term (current) drug therapy: Secondary | ICD-10-CM | POA: Insufficient documentation

## 2014-07-22 MED ORDER — PREDNISONE 20 MG PO TABS
60.0000 mg | ORAL_TABLET | Freq: Once | ORAL | Status: AC
  Administered 2014-07-22: 60 mg via ORAL
  Filled 2014-07-22: qty 3

## 2014-07-22 MED ORDER — HYDROCODONE-ACETAMINOPHEN 5-325 MG PO TABS
1.0000 | ORAL_TABLET | Freq: Once | ORAL | Status: AC
  Administered 2014-07-22: 1 via ORAL
  Filled 2014-07-22: qty 1

## 2014-07-22 MED ORDER — CYCLOBENZAPRINE HCL 10 MG PO TABS: 10.0000 mg | ORAL_TABLET | Freq: Two times a day (BID) | ORAL | Status: DC | PRN

## 2014-07-22 MED ORDER — PREDNISONE 20 MG PO TABS: 40.0000 mg | ORAL_TABLET | Freq: Every day | ORAL | Status: DC

## 2014-07-22 MED ORDER — IBUPROFEN 800 MG PO TABS: 800.0000 mg | ORAL_TABLET | Freq: Three times a day (TID) | ORAL | Status: DC | PRN

## 2014-07-22 NOTE — ED Notes (Signed)
Ortho on their way to do wrap and crutches.

## 2014-07-22 NOTE — Discharge Instructions (Signed)
Follow directions provided. Be sure to follow-up with the orthopedic doctor to further manage this back pain. Take the prednisone daily to help with the inflammation causing your back pain. Don't hesitate to return for any new, worsening, or concerning symptoms.  GET HELP RIGHT AWAY IF:  Your pain does not go away with rest or medicine.  Your pain does not go away in 1 week.  You have new problems.  You do not feel well.  The pain spreads into your legs.  You cannot control when you poop (bowel movement) or pee (urinate).  Your arms or legs feel weak or lose feeling (numbness).  You feel sick to your stomach (nauseous) or throw up (vomit).  You have belly (abdominal) pain.  You feel like you may pass out (faint).

## 2014-07-22 NOTE — ED Notes (Signed)
Pt c/o r foot pain. Reported she walking her dog this morning and her r leg locked due to hx of sciatic nerve. Pt twisted foot and heard a pop.  Reports pain 8/10.  Pt also c/o lower back pain described as sciatic nerve pain.  Denies fall.

## 2014-07-22 NOTE — Discharge Instructions (Signed)
Read the information below.  Use the prescribed medication as directed.  Please discuss all new medications with your pharmacist.  You may return to the Emergency Department at any time for worsening condition or any new symptoms that concern you.  If there is any possibility that you might be pregnant, please let your health care provider know and discuss this with the pharmacist to ensure medication safety.   If you develop fevers, loss of control of bowel or bladder, weakness or numbness in your legs, or are unable to walk, return to the ER for a recheck. If you develop uncontrolled pain, weakness or numbness of the extremity, severe discoloration of the skin, or you are unable to walk, return to the ER for a recheck.     Back Pain, Adult Low back pain is very common. About 1 in 5 people have back pain.The cause of low back pain is rarely dangerous. The pain often gets better over time.About half of people with a sudden onset of back pain feel better in just 2 weeks. About 8 in 10 people feel better by 6 weeks.  CAUSES Some common causes of back pain include:  Strain of the muscles or ligaments supporting the spine.  Wear and tear (degeneration) of the spinal discs.  Arthritis.  Direct injury to the back. DIAGNOSIS Most of the time, the direct cause of low back pain is not known.However, back pain can be treated effectively even when the exact cause of the pain is unknown.Answering your caregiver's questions about your overall health and symptoms is one of the most accurate ways to make sure the cause of your pain is not dangerous. If your caregiver needs more information, he or she may order lab work or imaging tests (X-rays or MRIs).However, even if imaging tests show changes in your back, this usually does not require surgery. HOME CARE INSTRUCTIONS For many people, back pain returns.Since low back pain is rarely dangerous, it is often a condition that people can learn to [facility] their  own.   Remain active. It is stressful on the back to sit or stand in one place. Do not sit, drive, or stand in one place for more than 30 minutes at a time. Take short walks on level surfaces as soon as pain allows.Try to increase the length of time you walk each day.  Do not stay in bed.Resting more than 1 or 2 days can delay your recovery.  Do not avoid exercise or work.Your body is made to move.It is not dangerous to be active, even though your back may hurt.Your back will likely heal faster if you return to being active before your pain is gone.  Pay attention to your body when you bend and lift. Many people have less discomfortwhen lifting if they bend their knees, keep the load close to their bodies,and avoid twisting. Often, the most comfortable positions are those that put less stress on your recovering back.  Find a comfortable position to sleep. Use a firm mattress and lie on your side with your knees slightly bent. If you lie on your back, put a pillow under your knees.  Only take over-the-counter or prescription medicines as directed by your caregiver. Over-the-counter medicines to reduce pain and inflammation are often the most helpful.Your caregiver may prescribe muscle relaxant drugs.These medicines help dull your pain so you can more quickly return to your normal activities and healthy exercise.  Put ice on the injured area.  Put ice in a plastic bag.  Place a towel between your skin and the bag.  Leave the ice on for 15-20 minutes, 03-04 times a day for the first 2 to 3 days. After that, ice and heat may be alternated to reduce pain and spasms.  Ask your caregiver about trying back exercises and gentle massage. This may be of some benefit.  Avoid feeling anxious or stressed.Stress increases muscle tension and can worsen back pain.It is important to recognize when you are anxious or stressed and learn ways to manage it.Exercise is a great option. SEEK MEDICAL  CARE IF:  You have pain that is not relieved with rest or medicine.  You have pain that does not improve in 1 week.  You have new symptoms.  You are generally not feeling well. SEEK IMMEDIATE MEDICAL CARE IF:   You have pain that radiates from your back into your legs.  You develop new bowel or bladder control problems.  You have unusual weakness or numbness in your arms or legs.  You develop nausea or vomiting.  You develop abdominal pain.  You feel faint. Document Released: 07/21/2005 Document Revised: 01/20/2012 Document Reviewed: 11/22/2013 [facility] Patient Information 2015 [place], [place]. This information is not intended to replace advice given to you by your health care provider. Make sure you discuss any questions you have with your health care provider.    Emergency Department Resource Guide 1) Find a Doctor and Pay Out of Pocket Although you won't have to find out who is covered by your insurance plan, it is a good idea to ask around and get recommendations. You will then need to call the office and see if the doctor you have chosen will accept you as a new patient and what types of options they offer for patients who are self-pay. Some doctors offer discounts or will set up payment plans for their patients who do not have insurance, but you will need to ask so you aren't surprised when you get to your appointment.  2) Contact Your Local Health Department Not all health departments have doctors that can see patients for sick visits, but many do, so it is worth a call to see if yours does. If you don't know where your local health department is, you can check in your phone book. The CDC also has a tool to help you locate your state's health department, and many state websites also have listings of all of their local health departments.  3) Find a Walk-in Clinic If your illness is not likely to be very severe or complicated, you may want to try a walk in clinic. These are  popping up all over the country in pharmacies, drugstores, and shopping centers. They're usually staffed by nurse practitioners or physician assistants that have been trained to treat common illnesses and complaints. They're usually fairly quick and inexpensive. However, if you have serious medical issues or chronic medical problems, these are probably not your best option.  No Primary Care Doctor: - Call Health Connect at  [phone] - they can help you locate a primary care doctor that  accepts your insurance, provides certain services, etc. - Physician Referral Service- [phone]  Chronic Pain Problems: Organization         Address  Phone   Notes  [facility]  [phone] Patients need to be referred by their primary care doctor.   Medication Assistance: Organization         Address  Phone   Notes  [facility]  Medication Assistance Program [address] [place] [place], [place] [address] [phone] --Must be a resident of [facility] -- Must have NO insurance coverage whatsoever (no Medicaid/ Medicare, etc.) -- The pt. MUST have a primary care doctor that directs their care regularly and follows them in the community   [facility]ist  [phone]   [employer]  [phone]    Agencies that provide inexpensive medical care: Organization         Address  Phone   Notes  [facility]  [phone]   [facility]    [phone]   [facility] [address] [place], [place] [address] [phone]   [facility] [address] [place], [place] [phone]   Planned Parenthood    [phone]   [facility]    [phone]   [facility] and [facility]  [address], [place] Phone:  [phone], Fax:  [phone] Hours of Operation:  9 am - 6 pm, M-F.  Also accepts Medicaid/Medicare and self-pay.  [facility]  [address], [place] Phone: [phone], Fax: [phone]. Hours of Operation:  8:30 am - 5:30 pm, M-F.  Also accepts Medicaid and self-pay.  [facility] [place] [address] [place] Phone: [phone]   [facility] [address] Natasha Bence [place], [place] [phone], Ext. [phone] Mondays & Thursdays: 7-9 AM.  First 15 patients are seen on a first come, first serve basis.    Medicaid-accepting [facility] Providers:  Organization         Address  Phone   Notes  [facility] [address], [place] [phone] Also accepts self-pay patients.  [facility] [address] Laurell Josephs [place], [place]  [phone]   [facility] [address], [place] [phone]   [facility] [address], [place] [phone]   Renaye Rakers [address], [place]   [phone] Only accepts [place] Access [place] patients after they have their name applied to their card.   Self-Pay (no insurance) in [facility]:  Organization         Address  Phone   Notes  Sickle Cell Patients, [facility] [address] [place], [place] [phone]   [facility] [address] [place], [place] [phone]   [facility] [place]  [address], [place] [phone]   [facility]/Dr. Osei-Bonsu  [address] [place] or [address], [place] [phone] Phone number for both [place] and [place] locations is the same.  [facility] [address], [place] [phone]   [facility] [address] [place] or [address] [phone]   [facility] [address], [place] [phone], phone; [phone], fax Sees patients 1st and 3rd Saturday  of every month.  Must not qualify for public or private insurance (i.e. Medicaid, Medicare, [place] Health Choice, Veterans' Benefits)  Household income should be no more than 200% of the poverty level The clinic cannot treat you if you are pregnant or think you are pregnant  Sexually transmitted diseases are not treated  at the clinic.    Dental Care: Organization         Address  Phone  Notes  [facility] [address], [place] [phone] Accepts children up to age 26 who are enrolled in [place] or [place] Health Choice; pregnant women with a Medicaid card; and children who have applied for Medicaid or [place] Health Choice, but were declined, whose parents can pay a reduced fee at time of service.  [facility] [place]  [address], [place] [phone] Accepts children up to age 26 who are enrolled in [place] or [place] Health Choice; pregnant women with a Medicaid card; and children who have applied for Medicaid or [place] Health Choice, but were declined, whose parents can pay a reduced fee at time of service.  [facility]  [address], [place] [phone] Patients are seen by appointment only. Walk-ins are not accepted. [facility] will see patients 26 years of age and older. Monday - Tuesday (8am-5pm) Most Wednesdays (8:30-5pm) $30 per visit, cash only  [facility]  [address], [facility] [place] [phone] Patients are seen by appointment only. Walk-ins are not accepted. [facility] will see patients 26 years of age and older. One Wednesday Evening (Monthly: Volunteer Based).  $30 per visit, cash only  [facility] [facility]  [phone]) [phone] for adults; Children under age 274, call Graduate Pediatric Dentistry at [phone]. Children aged 414-14, please call [phone] to request a pediatric application.   Dental services are provided in all areas of dental care including fillings, crowns and bridges, complete and partial dentures, implants, gum treatment, root canals, and extractions. Preventive care is also provided. Treatment is provided to both adults and children. Patients are selected via a lottery and there is often a waiting list.   [facility] [address], [place]  [phone] [email or web address]   [facility] [address], [place], [place] [phone], Ext. [phone] Second and Fourth Thursday of each month, opens at 6:30 AM; Clinic ends at 9 AM.  Patients are seen on a first-come first-served basis, and a limited number are seen during each clinic.   [facility]  [address], [place], [place] [phone]   Eligibility Requirements You must have lived in [place], [place], or [place] counties for at least the last three months.   You cannot be eligible for state or federal sponsored [place] Cityhealthcare insurance, including CIGNAVeterans Administration, [place]icaid, or [employer]icare.   You generally cannot be eligible for healthcare insurance through your employer.    How to apply: Eligibility screenings are held every Tuesday and Wednesday afternoon from 1:00 pm until 4:00 pm. You do not need an appointment for the interview!  [facility] [address], [place], [place] [phone]   [facility]  [phone]   [facility]  [phone]   [facility]  [phone]    Behavioral Health Resources in the Community: Intensive Outpatient Programs Organization         Address  Phone  Notes  [facility] [address], [place], [place] [phone]   [facility] [address], [place], [place] [phone]   [facility] [address], [place], [place]  [phone]   [facility] [address]  [place], [place] [phone] or [phone]   Substance Abuse Resources Organization         Address  Phone  Notes  [facility] Drug Services  [phone]   [facility]  [phone]   [facility] [place]  [phone]   Floydene Flock  [phone]   Residential & Outpatient Substance Abuse Program  [phone]   Psychological Services Organization         Address  Phone  Notes  [facility]al Health  [phone]   [facility]  [phone]   [facility] [address] [place] [phone] or [phone]    Mobile Crisis Teams Organization         Address  Phone  Notes  [facility], Mobile Crisis Care Unit  [phone]   [facility]  [address] [place], [place] [phone]   Doristine Locks [address] [place] [phone]    Self-Help/Support Groups Organization         Address  Phone             Notes  [facility] [place] - variety of support groups  336- I7437963 Call for more information  Narcotics Anonymous (NA), [facility] [address], [employer] [place]  2 meetings at this location   Statistician         Address  Phone  Notes  [facility] [address],    [place]  [phone]   [facility]  [address], [place] [address], [place], [place] [phone]   [facility] [address] [place], [place] [phone] Admissions: 8am-3pm M-F  [facility] [address]    [place], [place] [phone]   [facility] [address] [place], [place], [place] [phone]   [facility] [address],  [place], [place] [phone]   [facility] - Intensive Outpatient [address], [place], [place] [phone]   [facility] ([facility] [address] [place]  [place], [place]  [phone] or [phone]   [facility] ([facility]) [address] [place], [place] [phone] Accepts Medicaid  [facility] [address]  [place] [phone] Substance Abuse/Addiction Treatment   [facility] Organization         Address  Phone  Notes  [facility]  [phone]   Angie Fava, PhD [address] Ervin Knack [place], [place]   [phone] or [phone]   [facility]   [address] [place], [place] [phone]   [facility] [address] [place], [place] [phone] Insurance/Medicaid/sponsorship through [facility] and Families [address]                                    [place], [place] [phone] Therapy/tele-psych/case  [facility] [address], [place] [phone]    Dr. Lolly Mustache  [phone]   [facility]  [employer] [facility] 1) [address], [place] 2) [address] [place] 3)  [address], [place] [phone]  [phone]   [facility] Child Abuse Hotline (251) 644-3084 or [phone] (After Hours)

## 2014-07-22 NOTE — ED Notes (Signed)
Pt sts lower back pain with radiation down left leg starting today with pain in right ankle after twisting ankle
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
## 2015-07-09 ENCOUNTER — Encounter (HOSPITAL_COMMUNITY): Payer: Self-pay | Admitting: Emergency Medicine

## 2015-07-09 ENCOUNTER — Emergency Department (HOSPITAL_COMMUNITY)
Admission: EM | Admit: 2015-07-09 | Discharge: 2015-07-09 | Disposition: A | Discharge status: Home / Self Care | Payer: Medicaid Other | Attending: Emergency Medicine | Admitting: Emergency Medicine

## 2015-07-09 DIAGNOSIS — Z7952 Long term (current) use of systemic steroids: Secondary | ICD-10-CM | POA: Diagnosis not present

## 2015-07-09 DIAGNOSIS — F1123 Opioid dependence with withdrawal: Secondary | ICD-10-CM | POA: Insufficient documentation

## 2015-07-09 DIAGNOSIS — R451 Restlessness and agitation: Secondary | ICD-10-CM | POA: Insufficient documentation

## 2015-07-09 DIAGNOSIS — F1193 Opioid use, unspecified with withdrawal: Secondary | ICD-10-CM

## 2015-07-09 DIAGNOSIS — F11129 Opioid abuse with intoxication, unspecified: Secondary | ICD-10-CM | POA: Diagnosis present

## 2015-07-09 MED ORDER — CLONIDINE HCL 0.1 MG PO TABS: 0.1000 mg | ORAL_TABLET | Freq: Every day | ORAL | Status: DC | PRN

## 2015-07-09 MED ORDER — CLONIDINE HCL 0.1 MG PO TABS
0.1000 mg | ORAL_TABLET | Freq: Once | ORAL | Status: AC
  Administered 2015-07-09: 0.1 mg via ORAL
  Filled 2015-07-09: qty 1

## 2015-07-09 NOTE — ED Notes (Addendum)
Pt presents with generalized body aches/chills/decreased appetite and says, "I assume all of this is because I can't take care of myself-I'm trying to come off of pain pills." Last time she took Percocet was yesterday. Denies any ETOH or illicit drug use. Pt appears malnourished. Has not had menstrual cycle for 6 months. Denies N/V/D. No other c/c. Explicitly denies SI/HI.

## 2015-07-09 NOTE — Discharge Instructions (Signed)
Opioid Withdrawal  Opioids are a group of narcotic drugs. They include the street drug heroin. They also include pain medicines, such as morphine, hydrocodone, oxycodone, and fentanyl. Opioid withdrawal is a group of characteristic physical and mental signs and symptoms. It typically occurs if you have been using opioids daily for several weeks or longer and stop using or rapidly decrease use. Opioid withdrawal can also occur if you have used opioids daily for a long time and are given a medicine to block the effect.   SIGNS AND SYMPTOMS  Opioid withdrawal includes three or more of the following symptoms:   · Depressed, anxious, or irritable mood.  · Nausea or vomiting.  · Muscle aches or spasms.    · Watery eyes.     · Runny nose.  · Dilated pupils, sweating, or hairs standing on end.  · Diarrhea or intestinal cramping.  · Yawning.    · Fever.  · Increased blood pressure.  · Fast pulse.  · Restlessness or trouble sleeping.  These signs and symptoms occur within several hours of stopping or reducing short-acting opioids, such as heroin. They can occur within 3 days of stopping or reducing long-acting opioids, such as methadone. Withdrawal begins within minutes of receiving a drug that blocks the effects of opioids, such as naltrexone or naloxone.  DIAGNOSIS   Opioid use disorder is diagnosed by your health care provider. You will be asked about your symptoms, drug and alcohol use, medical history, and use of medicines. A physical exam may be done. Lab tests may be ordered. Your health care provider may have you see a mental health professional.   TREATMENT   The treatment for opioid withdrawal is usually provided by medical doctors with special training in substance use disorders (addiction specialists). The following medicines may be included in treatment:  · Opioids given in place of the abused opioid. They turn on opioid receptors in the brain and lessen or prevent withdrawal symptoms. They are gradually  decreased (opioid substitution and taper).  · Non-opioids that can lessen certain opioid withdrawal symptoms. They may be used alone or with opioid substitution and taper.  Successful long-term recovery usually requires medicine, counseling, and group support.  HOME CARE INSTRUCTIONS   · Take medicines only as directed by your health care provider.  · Check with your health care provider before starting new medicines.  · Keep all follow-up visits as directed by your health care provider.  SEEK MEDICAL CARE IF:  · You are not able to take your medicines as directed.  · Your symptoms get worse.  · You relapse.  SEEK IMMEDIATE MEDICAL CARE IF:  · You have serious thoughts about hurting yourself or others.  · You have a seizure.  · You lose consciousness.     This information is not intended to replace advice given to you by your health care provider. Make sure you discuss any questions you have with your health care provider.     Document Released: 07/24/2003 Document Revised: 08/11/2014 Document Reviewed: 08/03/2013  Elsevier Interactive Patient Education ©2016 Elsevier Inc.

## 2015-07-09 NOTE — ED Notes (Signed)
AVS explained in detail. Knows to follow up with [facility]. Given extra behavioral resources and substance abuse resources. Pt acknowledges understanding to follow up ASAP. No other c/c.

## 2015-07-09 NOTE — ED Provider Notes (Signed)
CSN: [id number]     Arrival date & time 07/09/15  1839 History   First MD Initiated Contact with Patient 07/09/15 1936     Chief Complaint  Patient presents with  . Generalized Body Aches  . Chills  . Detox      (Consider location/radiation/quality/duration/timing/severity/associated sxs/prior Treatment) Patient is a 29 y.o. female presenting with drug/alcohol assessment. The history is provided by the patient.  Drug / Alcohol Assessment Similar prior episodes: yes   Severity:  Moderate Onset quality:  Gradual Duration:  2 days Timing:  Constant Progression:  Unchanged Chronicity:  New Suspected agents:  Prescription drugs and heroin Associated symptoms: no vomiting   Associated symptoms comment:  Chills, agitation Risk factors: withdrawal syndrome     Past Medical History  Diagnosis Date  . No pertinent past medical history   . WUJWJXBJ(478.2)    Past Surgical History  Procedure Laterality Date  . No past surgeries    . Wisdom tooth extraction     Family History  Problem Relation Age of Onset  . Diabetes Mother   . Hypertension Mother   . Heart disease Paternal Grandfather   . Hypertension Paternal Grandfather    Social History  Substance Use Topics  . Smoking status: Never Smoker   . Smokeless tobacco: Never Used  . Alcohol Use: No   OB History    Gravida Para Term Preterm AB TAB SAB Ectopic Multiple Living   Review of Systems  Gastrointestinal: Negative for vomiting.  All other systems reviewed and are negative.     Allergies  Review of patient's allergies indicates no known allergies.  Home Medications   Prior to Admission medications   Medication Sig Start Date End Date Taking? Authorizing Provider  acetaminophen (TYLENOL) 500 MG tablet Take 1,000-1,500 mg by mouth every 4 (four) hours as needed for mild pain.    Historical Provider, MD  amoxicillin (AMOXIL) 500 MG capsule Take 1 capsule (500 mg total) by mouth 3 (three)  times daily. Patient not taking: Reported on 07/22/2014 04/17/14   Toni Amend Forcucci, PA-C  cyclobenzaprine (FLEXERIL) 10 MG tablet Take 1 tablet (10 mg total) by mouth 2 (two) times daily as needed for muscle spasms (or pain). 07/22/14   Trixie Dredge, PA-C  HYDROcodone-acetaminophen (NORCO/VICODIN) 5-325 MG per tablet Take 1 tablet by mouth every 6 (six) hours as needed for moderate pain or severe pain. Patient not taking: Reported on 07/22/2014 04/17/14   Toni Amend Forcucci, PA-C  predniSONE (DELTASONE) 20 MG tablet Take 2 tablets (40 mg total) by mouth daily. 07/22/14   Harle Battiest, NP   BP 115/86 mmHg  Pulse 104  Temp(Src) 97.9 F (36.6 C) (Oral)  Resp 16  Ht  (1.549 m)  Wt 94 lb (42.638 kg)  BMI 17.77 kg/m2  SpO2 100% Physical Exam  Constitutional: She is oriented to person, place, and time. She appears well-developed. She does not have a sickly appearance. No distress.  HENT:  Head: Normocephalic.  Eyes: Conjunctivae are normal.  Neck: Neck supple. No tracheal deviation present.  Cardiovascular: Normal rate and regular rhythm.   Pulmonary/Chest: Effort normal and breath sounds normal. No respiratory distress.  Abdominal: Soft. She exhibits no distension. There is no tenderness.  Neurological: She is alert and oriented to person, place, and time.  Skin: Skin is warm and dry.  Psychiatric: She has a normal mood and affect.    ED Course  Procedures (including critical care time) Labs Review Labs Reviewed - No data to display  Imaging Review No results found. I have personally reviewed and evaluated these images and lab results as part of my medical decision-making.   EKG Interpretation None      MDM   Final diagnoses:  Withdrawal from opioids [facility](HCC)    29 y.o. female presents with withdrawal from heroin after last use 3 days ago. Given clonidine here and 2 doses for home for ongoing difficulty. No indication for IP management or psych eval currently.  Provided info for OP detox referral. Plan to follow up with PCP as needed and return precautions discussed for worsening or new concerning symptoms.      Lyndal Pulleyaniel Wilferd Ritson, MD 07/10/15 [id number]

## 2015-07-09 NOTE — ED Notes (Signed)
Patient states she hasn't eaten in two days.  She is requesting help for pain meds.   She sates she isn't an IV user.

## 2015-07-10 ENCOUNTER — Emergency Department (HOSPITAL_COMMUNITY)
Admission: EM | Admit: 2015-07-10 | Discharge: 2015-07-10 | Disposition: A | Discharge status: Home / Self Care | Payer: Medicaid Other | Attending: Emergency Medicine | Admitting: Emergency Medicine

## 2015-07-10 ENCOUNTER — Encounter (HOSPITAL_COMMUNITY): Payer: Self-pay | Admitting: *Deleted

## 2015-07-10 DIAGNOSIS — F1193 Opioid use, unspecified with withdrawal: Secondary | ICD-10-CM

## 2015-07-10 DIAGNOSIS — Z3202 Encounter for pregnancy test, result negative: Secondary | ICD-10-CM | POA: Diagnosis not present

## 2015-07-10 DIAGNOSIS — F1123 Opioid dependence with withdrawal: Secondary | ICD-10-CM | POA: Diagnosis not present

## 2015-07-10 DIAGNOSIS — R112 Nausea with vomiting, unspecified: Secondary | ICD-10-CM | POA: Diagnosis present

## 2015-07-10 LAB — URINE MICROSCOPIC-ADD ON: RBC / HPF: NONE SEEN RBC/hpf (ref 0–5)

## 2015-07-10 LAB — URINALYSIS, ROUTINE W REFLEX MICROSCOPIC
BILIRUBIN URINE: NEGATIVE
GLUCOSE, UA: NEGATIVE mg/dL
HGB URINE DIPSTICK: NEGATIVE
KETONES UR: 40 mg/dL — AB
Nitrite: NEGATIVE
PH: 7 (ref 5.0–8.0)
Protein, ur: NEGATIVE mg/dL
SPECIFIC GRAVITY, URINE: 1.028 (ref 1.005–1.030)

## 2015-07-10 LAB — POC URINE PREG, ED: Preg Test, Ur: NEGATIVE

## 2015-07-10 MED ORDER — ONDANSETRON 4 MG PO TBDP: 4.0000 mg | ORAL_TABLET | Freq: Three times a day (TID) | ORAL | Status: DC | PRN

## 2015-07-10 MED ORDER — SODIUM CHLORIDE 0.9 % IV BOLUS (SEPSIS)
1000.0000 mL | Freq: Once | INTRAVENOUS | Status: AC
  Administered 2015-07-10: 1000 mL via INTRAVENOUS

## 2015-07-10 MED ORDER — ONDANSETRON HCL 4 MG/2ML IJ SOLN
4.0000 mg | Freq: Once | INTRAMUSCULAR | Status: AC
  Administered 2015-07-10: 4 mg via INTRAVENOUS
  Filled 2015-07-10: qty 2

## 2015-07-10 NOTE — ED Notes (Signed)
Pt arrives to the ER via EMS for complaints of withdrawls; pt states that she is withdrawing from Oxycodone and Heroin; pt states that she is having chills, N/V and generalized pain; pt moaning after this RN entered the room; pt reports vomiting; no emesis seen via EMS or upon arrival; pt was seen earlier for the same and give Clonidine

## 2015-07-10 NOTE — Discharge Instructions (Signed)
Opioid withdrawal is an uncomfortable but not a life-threatening condition. You need to follow-up with resources provided. Take clonidine as previously directed. Also, you will be given Zofran for symptom management. If you develop diarrhea you may take Imodium over-the-counter.  Opioid Withdrawal Opioids are a group of narcotic drugs. They include the street drug heroin. They also include pain medicines, such as morphine, hydrocodone, oxycodone, and fentanyl. Opioid withdrawal is a group of characteristic physical and mental signs and symptoms. It typically occurs if you have been using opioids daily for several weeks or longer and stop using or rapidly decrease use. Opioid withdrawal can also occur if you have used opioids daily for a long time and are given a medicine to block the effect.  SIGNS AND SYMPTOMS Opioid withdrawal includes three or more of the following symptoms:   Depressed, anxious, or irritable mood.  Nausea or vomiting.  Muscle aches or spasms.   Watery eyes.   Runny nose.  Dilated pupils, sweating, or hairs standing on end.  Diarrhea or intestinal cramping.  Yawning.   Fever.  Increased blood pressure.  Fast pulse.  Restlessness or trouble sleeping. These signs and symptoms occur within several hours of stopping or reducing short-acting opioids, such as heroin. They can occur within 3 days of stopping or reducing long-acting opioids, such as methadone. Withdrawal begins within minutes of receiving a drug that blocks the effects of opioids, such as naltrexone or naloxone. DIAGNOSIS  Opioid use disorder is diagnosed by your health care provider. You will be asked about your symptoms, drug and alcohol use, medical history, and use of medicines. A physical exam may be done. Lab tests may be ordered. Your health care provider may have you see a mental health professional.  TREATMENT  The treatment for opioid withdrawal is usually provided by medical doctors with  special training in substance use disorders (addiction specialists). The following medicines may be included in treatment:  Opioids given in place of the abused opioid. They turn on opioid receptors in the brain and lessen or prevent withdrawal symptoms. They are gradually decreased (opioid substitution and taper).  Non-opioids that can lessen certain opioid withdrawal symptoms. They may be used alone or with opioid substitution and taper. Successful long-term recovery usually requires medicine, counseling, and group support. HOME CARE INSTRUCTIONS   Take medicines only as directed by your health care provider.  Check with your health care provider before starting new medicines.  Keep all follow-up visits as directed by your health care provider. SEEK MEDICAL CARE IF:  You are not able to take your medicines as directed.  Your symptoms get worse.  You relapse. SEEK IMMEDIATE MEDICAL CARE IF:  You have serious thoughts about hurting yourself or others.  You have a seizure.  You lose consciousness.   This information is not intended to replace advice given to you by your health care provider. Make sure you discuss any questions you have with your health care provider.   Document Released: 07/24/2003 Document Revised: 08/11/2014 Document Reviewed: 08/03/2013 Elsevier Interactive Patient Education [employer]2016 Elsevier Inc.

## 2015-07-10 NOTE — ED Notes (Signed)
Bed: ZO10WA22 Expected date:  Expected time:  Means of arrival:  Comments: Withdrawal sx from opiates

## 2015-07-10 NOTE — ED Provider Notes (Signed)
CSN: [id number]     Arrival date & time 07/10/15  0420 History   First MD Initiated Contact with Patient 07/10/15 0424     Chief Complaint  Patient presents with  . Withdrawl Symptoms      (Consider location/radiation/quality/duration/timing/severity/associated sxs/prior Treatment) HPI  This is a 28 year old female who presents with multiple complaints concerning for withdrawal. Patient was seen and evaluated yesterday for the same. She was discharged home with clonidine. She is not taking any other medication since being at home. She reports generalized bodyaches, nausea, vomiting. Denies any diarrhea but states "I think I could go to the bathroom." Reports that she's not been able to keep anything down. 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 She denies any other drug abuse or alcohol abuse. She denies any other pain. She was given resources last night for follow-up as an outpatient. She states that she is still interested in detox but "feels miserable."  Past Medical History  Diagnosis Date  . No pertinent past medical history   . IONGEXBM(841.3)    Past Surgical History  Procedure Laterality Date  . No past surgeries    . Wisdom tooth extraction     Family History  Problem Relation Age of Onset  . Diabetes Mother   . Hypertension Mother   . Heart disease Paternal Grandfather   . Hypertension Paternal Grandfather    Social History  Substance Use Topics  . Smoking status: Never Smoker   . Smokeless tobacco: Never Used  . Alcohol Use: No   OB History    Gravida Para Term Preterm AB TAB SAB Ectopic Multiple Living   Review of Systems  Constitutional: Negative for fever.  Respiratory: Negative for cough, chest tightness and shortness of breath.   Cardiovascular: Negative for chest pain.  Gastrointestinal: Positive for nausea and vomiting. Negative for abdominal pain and diarrhea.  Genitourinary: Negative for dysuria.   Musculoskeletal: Positive for myalgias.  Neurological: Negative for headaches.  All other systems reviewed and are negative.     Allergies  Review of patient's allergies indicates no known allergies.  Home Medications   Prior to Admission medications   Medication Sig Start Date End Date Taking? Authorizing Provider  cloNIDine (CATAPRES) 0.1 MG tablet Take 1 tablet (0.1 mg total) by mouth daily as needed (withdrawal). Patient not taking: Reported on 07/10/2015 07/09/15   Lyndal Pulley, MD  ondansetron (ZOFRAN ODT) 4 MG disintegrating tablet Take 1 tablet (4 mg total) by mouth every 8 (eight) hours as needed for nausea or vomiting. 07/10/15   Shon Baton, MD   BP 111/81 mmHg  Pulse 77  Temp(Src) 98.2 F (36.8 C) (Oral)  Resp 18  SpO2 100% Physical Exam  Constitutional: She is oriented to person, place, and time. No distress.  HENT:  Head: Normocephalic and atraumatic.  Mucous membranes dry  Eyes: Pupils are equal, round, and reactive to light.  Cardiovascular: Normal rate, regular rhythm and normal heart sounds.   No murmur heard. Pulmonary/Chest: Effort normal and breath sounds normal. No respiratory distress. She has no wheezes.  Abdominal: Soft. Bowel sounds are normal. There is tenderness. There is no rebound and no guarding.  Minimal tenderness to the epigastrium without rebound or guarding  Musculoskeletal: She exhibits no edema.  Neurological: She is alert and oriented to person, place, and time.  Skin: Skin is warm and dry.  Psychiatric: She  has a normal mood and affect.  Nursing note and vitals reviewed.   ED Course  Procedures (including critical care time) Labs Review Labs Reviewed  URINALYSIS, ROUTINE W REFLEX MICROSCOPIC (NOT AT [facility]) - Abnormal; Notable for the following:    APPearance TURBID (*)    Ketones, ur 40 (*)    Leukocytes, UA TRACE (*)    All other components within normal limits  URINE MICROSCOPIC-ADD ON - Abnormal; Notable for the  following:    Squamous Epithelial / LPF 6-30 (*)    Bacteria, UA RARE (*)    All other components within normal limits  POC URINE PREG, ED    Imaging Review No results found. I have personally reviewed and evaluated these images and lab results as part of my medical decision-making.   EKG Interpretation None      MDM   Final diagnoses:  Opioid withdrawal (HCC)    Patient presents with symptoms she associates withdrawal. Nontoxic on exam. Afebrile and vital signs reassuring. She does have dry mucous membranes. Discussed with patient that narcotic withdrawal is not life-threatening but is very uncomfortable. Will place an IV and give her fluids and Zofran. She will not receive narcotic pain medications in the ER. Urinalysis obtained and shows 40 ketones but otherwise unremarkable. Do not feel she needs any further lab work at this time.  Discussed with patient that opioid withdrawal is uncomfortable. Discharged with Zofran. Take clonidine as previously directed. She was again given resources for outpatient follow-up.  After history, exam, and medical workup I feel the patient has been appropriately medically screened and is safe for discharge home. Pertinent diagnoses were discussed with the patient. Patient was given return precautions.    Shon Batonourtney F Flo Berroa, MD 07/10/15 [id number]

## 2015-11-27 ENCOUNTER — Emergency Department (HOSPITAL_COMMUNITY)
Admission: EM | Admit: 2015-11-27 | Discharge: 2015-11-27 | Disposition: A | Discharge status: Home / Self Care | Payer: Medicaid Other | Attending: Emergency Medicine | Admitting: Emergency Medicine

## 2015-11-27 ENCOUNTER — Encounter (HOSPITAL_COMMUNITY): Payer: Self-pay | Admitting: Family Medicine

## 2015-11-27 DIAGNOSIS — M25561 Pain in right knee: Secondary | ICD-10-CM | POA: Diagnosis present

## 2015-11-27 DIAGNOSIS — L03115 Cellulitis of right lower limb: Secondary | ICD-10-CM | POA: Diagnosis not present

## 2015-11-27 DIAGNOSIS — Z87828 Personal history of other (healed) physical injury and trauma: Secondary | ICD-10-CM | POA: Insufficient documentation

## 2015-11-27 MED ORDER — OXYCODONE-ACETAMINOPHEN 5-325 MG PO TABS: 1.0000 | ORAL_TABLET | Freq: Once | ORAL | Status: DC

## 2015-11-27 MED ORDER — NAPROXEN 500 MG PO TABS: 500.0000 mg | ORAL_TABLET | Freq: Two times a day (BID) | ORAL | Status: DC

## 2015-11-27 MED ORDER — DOXYCYCLINE HYCLATE 100 MG PO CAPS: 100.0000 mg | ORAL_CAPSULE | Freq: Two times a day (BID) | ORAL | Status: AC

## 2015-11-27 MED ORDER — NAPROXEN 250 MG PO TABS
500.0000 mg | ORAL_TABLET | Freq: Once | ORAL | Status: AC
  Administered 2015-11-27: 500 mg via ORAL
  Filled 2015-11-27: qty 2

## 2015-11-27 MED ORDER — DOXYCYCLINE HYCLATE 100 MG PO TABS
100.0000 mg | ORAL_TABLET | Freq: Once | ORAL | Status: AC
  Administered 2015-11-27: 100 mg via ORAL
  Filled 2015-11-27: qty 1

## 2015-11-27 NOTE — Discharge Instructions (Signed)
Take doxycycline as prescribed. Your next dose is due at 11 PM. Take naproxen as prescribed for pain as needed. Continue to elevate your leg as much as possible. Take naproxen as needed for pain control. Keep the area covered to prevent worsening or new infection. Return to the Emergency Department if you develop significant redness beyond the borders marked, red streaking up your leg, or fever.  Cellulitis Cellulitis is an infection of the skin and the tissue beneath it. The infected area is usually red and tender. Cellulitis occurs most often in the arms and lower legs.  CAUSES  Cellulitis is caused by bacteria that enter the skin through cracks or cuts in the skin. The most common types of bacteria that cause cellulitis are staphylococci and streptococci. SIGNS AND SYMPTOMS   Redness and warmth.  Swelling.  Tenderness or pain.  Fever. DIAGNOSIS  Your health care provider can usually determine what is wrong based on a physical exam. Blood tests may also be done. TREATMENT  Treatment usually involves taking an antibiotic medicine. HOME CARE INSTRUCTIONS   Take your antibiotic medicine as directed by your health care provider. Finish the antibiotic even if you start to feel better.  Keep the infected arm or leg elevated to reduce swelling.  Apply a warm cloth to the affected area up to 4 times per day to relieve pain.  Take medicines only as directed by your health care provider.  Keep all follow-up visits as directed by your health care provider. SEEK MEDICAL CARE IF:   You notice red streaks coming from the infected area.  Your red area gets larger or turns dark in color.  Your bone or joint underneath the infected area becomes painful after the skin has healed.  Your infection returns in the same area or another area.  You notice a swollen bump in the infected area.  You develop new symptoms.  You have a fever. SEEK IMMEDIATE MEDICAL CARE IF:   You feel very  sleepy.  You develop vomiting or diarrhea.  You have a general ill feeling (malaise) with muscle aches and pains.   This information is not intended to replace advice given to you by your health care provider. Make sure you discuss any questions you have with your health care provider.   Document Released: 04/30/2005 Document Revised: 04/11/2015 Document Reviewed: 10/06/2011 Elsevier Interactive Patient Education [employer]

## 2015-11-27 NOTE — ED Notes (Signed)
Pt presents from home via POV with c/o right knee pain with possible infection.  She fell a few weeks ago and scraped her knee, two days ago a blister developed and it popped last night.  She is concerned for MRSA as her husband and daughter have both has MRSA in the past.  She has no personal history.  Pt is A&Ox4, able to ambulate in treatment room with good ROM.  Site is approx 0.5cm in diameter with a yellow center; No drainage from site.

## 2015-11-27 NOTE — ED Provider Notes (Signed)
CSN: [id number]     Arrival date & time 11/27/15  [id number] History   First MD Initiated Contact with Patient 11/27/15 1001     Chief Complaint  Patient presents with  . Knee Pain     (Consider location/radiation/quality/duration/timing/severity/associated sxs/prior Treatment) HPI Comments: 30 year old female since to the emergency department for evaluation of right knee pain. Patient states that she fell a few weeks ago and scraped her knee. She noticed a blister over her right patella 2 days ago which popped last night. Patient has had increasing pain, erythema, and swelling since this time. She is concerned about MRSA as she states her husband is a carrier. She has been applying topical antibiotic ointment without relief. She experiences pain in her knee with ambulation with mild radiation along the medial aspect of her thigh. No medications taken prior to arrival for symptoms. Patient denies fever, extremity numbness, extremity weakness, or red linear streaking. 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  Patient is a 30 y.o. female presenting with knee pain. The history is provided by the patient. No language interpreter was used.  Knee Pain Associated symptoms: no fever     Past Medical History  Diagnosis Date  . No pertinent past medical history   . JXBJYNWG(956.2Headache(784.0)    Past Surgical History  Procedure Laterality Date  . No past surgeries    . Wisdom tooth extraction     Family History  Problem Relation Age of Onset  . Diabetes Mother   . Hypertension Mother   . Heart disease Paternal Grandfather   . Hypertension Paternal Grandfather    Social History  Substance Use Topics  . Smoking status: Never Smoker   . Smokeless tobacco: Never Used  . Alcohol Use: No   OB History    Gravida Para Term Preterm AB TAB SAB Ectopic Multiple Living   3 3 2       2       Review of  Systems  Constitutional: Negative for fever.  Musculoskeletal: Positive for arthralgias.  Skin: Positive for color change and wound.  Neurological: Negative for weakness and numbness.  All other systems reviewed and are negative.   Allergies  Review of patient's allergies indicates no known allergies.  Home Medications   Prior to Admission medications   Medication Sig Start Date End Date Taking? Authorizing Provider  cloNIDine (CATAPRES) 0.1 MG tablet Take 1 tablet (0.1 mg total) by mouth daily as needed (withdrawal). Patient not taking: Reported on 07/10/2015 07/09/15   Lyndal Pulleyaniel Knott, MD  ondansetron (ZOFRAN ODT) 4 MG disintegrating tablet Take 1 tablet (4 mg total) by mouth every 8 (eight) hours as needed for nausea or vomiting. 07/10/15   Shon Batonourtney F Horton, MD   BP 129/96 mmHg  Pulse 89  Temp(Src) 98.2 F (36.8 C) (Oral)  Resp 16  Ht 5' 1.25" (1.556 m)  SpO2 99%   Physical Exam  Constitutional: She is oriented to person, place, and time. She appears well-developed and well-nourished. No distress.  HENT:  Head: Normocephalic and atraumatic.  Eyes: Conjunctivae and EOM are normal. No scleral icterus.  Neck: Normal range of motion.  Cardiovascular: Normal rate, regular rhythm and intact distal pulses.   DP and PT pulses 2+ in the RLE  Pulmonary/Chest: Effort normal. No respiratory distress.  Respirations even and unlabored  Musculoskeletal: Normal range of motion.  Right knee: She exhibits swelling and erythema. She exhibits normal range of motion, no LCL laxity and no MCL laxity. Tenderness (diffuse) found.       Legs: Erythema extending out from a central blister over the R patella. There is associated TTP. No induration. Scan serous drainage. No red linear streaking or purulence. Normal ROM of the R knee with flexion and extension.  Neurological: She is alert and oriented to person, place, and time. She exhibits normal muscle tone. Coordination normal.  Sensation to  light touch intact in the RLE  Skin: Skin is warm and dry. No rash noted. She is not diaphoretic. No erythema. No pallor.  Psychiatric: She has a normal mood and affect. Her behavior is normal.  Nursing note and vitals reviewed.   ED Course  Procedures (including critical care time) Labs Review Labs Reviewed - No data to display  Imaging Review No results found. I have personally reviewed and evaluated these images and lab results as part of my medical decision-making.   EKG Interpretation None         MDM   Final diagnoses:  Cellulitis of right lower extremity    30 year old female since to the emergency department for findings consistent with cellulitis. No physical exam findings to suggest abscess. Low suspicion for septic joint. Patient has preserved range of motion of her right lower extremity. She is afebrile and neurovascularly intact. Patient denies a history of IV drug use. Will start on doxycycline and have advised return if the erythema spreads beyond documented borders or if she develops fever or red linear streaking. Return precautions discussed and provided. Patient discharged in satisfactory condition with no unaddressed concerns. Opioid pain medication withheld given history of opioid addiction.   Filed Vitals:   11/27/15 0945 11/27/15 1030  BP: 129/96 112/82  Pulse: 89 90  Temp: 98.2 F (36.8 C)   TempSrc: Oral   Resp: 16   Height: 5' 1.25" (1.556 m)   SpO2: 99% 99%     Antony Madura, PA-C 11/27/15 1417  Alvira Monday, MD 11/28/15 1658

## 2019-06-18 ENCOUNTER — Emergency Department (HOSPITAL_COMMUNITY)
Admission: EM | Admit: 2019-06-18 | Discharge: 2019-06-18 | Discharge status: Against Medical Advice | Payer: Medicaid Other | Attending: Emergency Medicine | Admitting: Emergency Medicine

## 2019-06-18 ENCOUNTER — Other Ambulatory Visit: Payer: Self-pay

## 2019-06-18 ENCOUNTER — Encounter (HOSPITAL_COMMUNITY): Payer: Self-pay | Admitting: Emergency Medicine

## 2019-06-18 DIAGNOSIS — Y929 Unspecified place or not applicable: Secondary | ICD-10-CM | POA: Diagnosis not present

## 2019-06-18 DIAGNOSIS — Y999 Unspecified external cause status: Secondary | ICD-10-CM | POA: Diagnosis not present

## 2019-06-18 DIAGNOSIS — Y939 Activity, unspecified: Secondary | ICD-10-CM | POA: Insufficient documentation

## 2019-06-18 DIAGNOSIS — R109 Unspecified abdominal pain: Secondary | ICD-10-CM | POA: Insufficient documentation

## 2019-06-18 DIAGNOSIS — S30810A Abrasion of lower back and pelvis, initial encounter: Secondary | ICD-10-CM | POA: Diagnosis not present

## 2019-06-18 DIAGNOSIS — S0011XA Contusion of right eyelid and periocular area, initial encounter: Secondary | ICD-10-CM | POA: Insufficient documentation

## 2019-06-18 DIAGNOSIS — Z5320 Procedure and treatment not carried out because of patient's decision for unspecified reasons: Secondary | ICD-10-CM | POA: Diagnosis not present
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
## 2019-06-18 NOTE — ED Notes (Signed)
Patient left room and went to check on her boyfriend. Patient states she feels better and does not need to be evaluated any longer. PA notified. Patient leaving AMA.

## 2019-08-14 ENCOUNTER — Other Ambulatory Visit: Payer: Self-pay

## 2019-08-14 ENCOUNTER — Emergency Department (HOSPITAL_COMMUNITY): Payer: Medicaid Other

## 2019-08-14 ENCOUNTER — Emergency Department (HOSPITAL_COMMUNITY)
Admission: EM | Admit: 2019-08-14 | Discharge: 2019-08-15 | Disposition: A | Discharge status: Home / Self Care | Payer: Medicaid Other | Attending: Emergency Medicine | Admitting: Emergency Medicine

## 2019-08-14 ENCOUNTER — Encounter (HOSPITAL_COMMUNITY): Payer: Self-pay | Admitting: Emergency Medicine

## 2019-08-14 DIAGNOSIS — U071 COVID-19: Secondary | ICD-10-CM | POA: Diagnosis not present

## 2019-08-14 DIAGNOSIS — R05 Cough: Secondary | ICD-10-CM | POA: Diagnosis present

## 2019-08-14 DIAGNOSIS — Z791 Long term (current) use of non-steroidal anti-inflammatories (NSAID): Secondary | ICD-10-CM | POA: Diagnosis not present

## 2019-08-14 NOTE — ED Triage Notes (Signed)
Patient arrived with EMS from home , patient exposed to Covid+ individual , reports fever , chills , emesis , and body aches onset yesterday , she took Tylenol for fever prior to arrival .

## 2019-08-15 LAB — CBC WITH DIFFERENTIAL/PLATELET
Abs Immature Granulocytes: 0.02 10*3/uL (ref 0.00–0.07)
Basophils Absolute: 0 10*3/uL (ref 0.0–0.1)
Basophils Relative: 0 %
Eosinophils Absolute: 0 10*3/uL (ref 0.0–0.5)
Eosinophils Relative: 0 %
HCT: 43.9 % (ref 36.0–46.0)
Hemoglobin: 14.4 g/dL (ref 12.0–15.0)
Immature Granulocytes: 0 %
Lymphocytes Relative: 24 %
Lymphs Abs: 1.5 10*3/uL (ref 0.7–4.0)
MCH: 27.7 pg (ref 26.0–34.0)
MCHC: 32.8 g/dL (ref 30.0–36.0)
MCV: 84.4 fL (ref 80.0–100.0)
Monocytes Absolute: 0.4 10*3/uL (ref 0.1–1.0)
Monocytes Relative: 6 %
Neutro Abs: 4.3 10*3/uL (ref 1.7–7.7)
Neutrophils Relative %: 70 %
Platelets: 386 10*3/uL (ref 150–400)
RBC: 5.2 MIL/uL — ABNORMAL HIGH (ref 3.87–5.11)
RDW: 14.6 % (ref 11.5–15.5)
WBC: 6.2 10*3/uL (ref 4.0–10.5)
nRBC: 0 % (ref 0.0–0.2)

## 2019-08-15 LAB — I-STAT BETA HCG BLOOD, ED (MC, WL, AP ONLY): I-stat hCG, quantitative: <5 m[IU]/mL (ref <5)

## 2019-08-15 LAB — COMPREHENSIVE METABOLIC PANEL
ALT: 25 U/L (ref 0–44)
AST: 25 U/L (ref 15–41)
Albumin: 4.7 g/dL (ref 3.5–5.0)
Alkaline Phosphatase: 71 U/L (ref 38–126)
Anion gap: 20 — ABNORMAL HIGH (ref 5–15)
BUN: 18 mg/dL (ref 6–20)
CO2: 19 mmol/L — ABNORMAL LOW (ref 22–32)
Calcium: 9.8 mg/dL (ref 8.9–10.3)
Chloride: 100 mmol/L (ref 98–111)
Creatinine, Ser: 0.78 mg/dL (ref 0.44–1.00)
GFR calc Af Amer: >60 mL/min (ref >60)
GFR calc non Af Amer: >60 mL/min (ref >60)
Glucose, Bld: 103 mg/dL — ABNORMAL HIGH (ref 70–99)
Potassium: 3.7 mmol/L (ref 3.5–5.1)
Sodium: 139 mmol/L (ref 135–145)
Total Bilirubin: 0.8 mg/dL (ref 0.3–1.2)
Total Protein: 9 g/dL — ABNORMAL HIGH (ref 6.5–8.1)

## 2019-08-15 LAB — LIPASE, BLOOD: Lipase: 31 U/L (ref 11–51)

## 2019-08-15 LAB — POC SARS CORONAVIRUS 2 AG -  ED: SARS Coronavirus 2 Ag: POSITIVE — AB

## 2019-08-15 LAB — TROPONIN I (HIGH SENSITIVITY): Troponin I (High Sensitivity): <2 ng/L (ref <18)

## 2019-08-15 MED ORDER — ONDANSETRON HCL 4 MG/2ML IJ SOLN
4.0000 mg | Freq: Once | INTRAMUSCULAR | Status: AC
  Administered 2019-08-15: 4 mg via INTRAVENOUS
  Filled 2019-08-15: qty 2

## 2019-08-15 MED ORDER — AEROCHAMBER PLUS MISC: 2 refills | Status: AC

## 2019-08-15 MED ORDER — ALBUTEROL SULFATE HFA 108 (90 BASE) MCG/ACT IN AERS: 2.0000 | INHALATION_SPRAY | RESPIRATORY_TRACT | 0 refills | Status: AC | PRN

## 2019-08-15 MED ORDER — ONDANSETRON 4 MG PO TBDP: 4.0000 mg | ORAL_TABLET | Freq: Three times a day (TID) | ORAL | 0 refills | Status: AC | PRN

## 2019-08-15 MED ORDER — METOCLOPRAMIDE HCL 5 MG/ML IJ SOLN
10.0000 mg | Freq: Once | INTRAMUSCULAR | Status: AC
  Administered 2019-08-15: 10 mg via INTRAVENOUS
  Filled 2019-08-15: qty 2

## 2019-08-15 MED ORDER — SODIUM CHLORIDE 0.9 % IV BOLUS
500.0000 mL | Freq: Once | INTRAVENOUS | Status: AC
  Administered 2019-08-15: 500 mL via INTRAVENOUS

## 2019-08-15 NOTE — ED Notes (Signed)
Pt ambulated in room and SATS stayed above 95% on RA.

## 2019-08-15 NOTE — Discharge Instructions (Addendum)
Thank you for allowing me to care for you today in the Emergency Department.   You were seen today for chest pain, fever, chills, body aches, nausea, and vomiting.  You were found to have COVID-19.  Your work-up was otherwise reassuring to discharge you home.  Take 650 mg of Tylenol or 600 mg of ibuprofen with food every 6 hours for pain or fever.  You can alternate between these 2 medications every 3 hours if your pain returns.  For instance, you can take Tylenol at noon, followed by a dose of ibuprofen at 3, followed by second dose of Tylenol and 6.  Let 1 tablet of Zofran dissolve in your tongue every 8 hours as needed for nausea or vomiting.  Make sure to drink plenty of fluids to avoid getting dehydrated.  For chest pain or shortness of breath, you can use 2 puffs of the albuterol inhaler with a spacer once every 4 hours as needed.  Follow-up with primary care if your symptoms persist.  Return to the emergency department if you develop respiratory distress, if you pass out, if your fingers or lips turn blue, if you have severe vomiting with frequent episodes despite taking Zofran, if you stop producing urine, or if you develop other new, concerning symptoms.

## 2019-08-15 NOTE — ED Notes (Signed)
Patient called out complaining of nausea and stated she threw up after drinking water. Requests medication for nausea.

## 2019-08-15 NOTE — ED Notes (Signed)
Informed RN Andruw pt covid pos

## 2019-08-15 NOTE — ED Provider Notes (Signed)
[facility] EMERGENCY DEPARTMENT Provider Note   CSN: [id number] Arrival date & time: 08/14/19  2032     History Chief Complaint  Patient presents with  . Covid+ Exposure / Fever/Chills/Emesis/BodyAches    [name] is a 34 y.o. female with a history of opioid withdrawal who presents to the emergency department with a chief complaint of vomiting.  The patient endorses approximately 5 episodes of nonbloody, nonbilious vomiting accompanied by bilateral chest pain and pressure, nonproductive cough, shortness of breath, fever, myalgias, and chills, onset yesterday.  She treated her symptoms with Tylenol just prior to arrival.  She denies abdominal pain, dysuria, hematuria, vaginal pain or discharge, numbness, weakness, or rash.  The patient reports that she found out that a friend that she spent New Year's Eve with tested positive for COVID-19 around January 2 or 3.  [name] was evaluated in Emergency Department on 08/15/2019 for the symptoms described in the history of present illness. She was evaluated in the context of the global COVID-19 pandemic, which necessitated consideration that the patient might be at risk for infection with the SARS-CoV-2 virus that causes COVID-19. Institutional protocols and algorithms that pertain to the evaluation of patients at risk for COVID-19 are in a state of rapid change based on information released by regulatory bodies including the CDC and federal and state organizations. These policies and algorithms were followed during the patient's care in the ED.   The history is provided by the patient. No language interpreter was used.       Past Medical History:  Diagnosis Date  . Headache(784.0)   . No pertinent past medical history     Patient Active Problem List   Diagnosis Date Noted  . Late prenatal care 03/29/2013  . SVD (spontaneous vaginal delivery) 03/29/2013    Past Surgical History:  Procedure Laterality  Date  . NO PAST SURGERIES    . WISDOM TOOTH EXTRACTION       OB History    Gravida  3   Para  3   Term  2   Preterm      AB      Living  2     SAB      TAB      Ectopic      Multiple      Live Births  2           Family History  Problem Relation Age of Onset  . Diabetes Mother   . Hypertension Mother   . Heart disease Paternal Grandfather   . Hypertension Paternal Grandfather     Social History   Tobacco Use  . Smoking status: Never Smoker  . Smokeless tobacco: Never Used  Substance Use Topics  . Alcohol use: No  . Drug use: No    Home Medications Prior to Admission medications   Medication Sig Start Date End Date Taking? Authorizing Provider  albuterol (VENTOLIN HFA) 108 (90 Base) MCG/ACT inhaler Inhale 2 puffs into the lungs every 4 (four) hours as needed for wheezing or shortness of breath. 08/15/19   Declyn Offield A, PA-C  doxycycline (VIBRAMYCIN) 100 MG capsule Take 1 capsule (100 mg total) by mouth 2 (two) times daily. 11/27/15   Antonietta Breach, PA-C  naproxen (NAPROSYN) 500 MG tablet Take 1 tablet (500 mg total) by mouth 2 (two) times daily. 11/27/15   Antonietta Breach, PA-C  ondansetron (ZOFRAN ODT) 4 MG disintegrating tablet Take 1 tablet (4 mg total) by mouth every  8 (eight) hours as needed. 08/15/19   Tallia Moehring A, PA-C  Spacer/Aero-Holding Chambers (AEROCHAMBER PLUS) inhaler Use as instructed 08/15/19   Bobbi Yount A, PA-C  cloNIDine (CATAPRES) 0.1 MG tablet Take 1 tablet (0.1 mg total) by mouth daily as needed (withdrawal). Patient not taking: Reported on 07/10/2015 07/09/15 08/15/19  Lyndal Pulley, MD    Allergies    Patient has no known allergies.  Review of Systems   Review of Systems  Constitutional: Positive for chills and fever. Negative for activity change and diaphoresis.  HENT: Negative for congestion and sore throat.   Respiratory: Negative for shortness of breath.   Cardiovascular: Negative for chest pain.  Gastrointestinal:  Positive for abdominal pain, diarrhea, nausea and vomiting.  Genitourinary: Negative for dysuria, frequency, hematuria, pelvic pain and vaginal pain.  Musculoskeletal: Positive for myalgias. Negative for back pain, gait problem, joint swelling, neck pain and neck stiffness.  Skin: Negative for rash.  Allergic/Immunologic: Negative for immunocompromised state.  Neurological: Negative for dizziness, seizures, syncope, weakness and headaches.  Psychiatric/Behavioral: Negative for confusion.    Physical Exam Updated Vital Signs BP 113/69 (BP Location: Right Arm)   Pulse 78   Temp 98.6 F (37 C) (Oral)   Resp 20   LMP 08/07/2019 (Approximate)   SpO2 98%   Physical Exam Vitals and nursing note reviewed.  Constitutional:      General: She is not in acute distress.    Appearance: She is not ill-appearing, toxic-appearing or diaphoretic.     Comments: Crying and proclaiming "my chest hurts."   HENT:     Head: Normocephalic.  Eyes:     Conjunctiva/sclera: Conjunctivae normal.  Cardiovascular:     Rate and Rhythm: Normal rate and regular rhythm.     Pulses: Normal pulses.     Heart sounds: Normal heart sounds. No murmur. No friction rub. No gallop.   Pulmonary:     Effort: Pulmonary effort is normal. No respiratory distress.     Breath sounds: No stridor. No wheezing, rhonchi or rales.     Comments: Able to speak in complete, fluent sentences.  No increased work of breathing.  Lungs are clear to auscultation bilaterally. Chest:     Chest wall: No tenderness.  Abdominal:     General: There is no distension.     Palpations: Abdomen is soft. There is no mass.     Tenderness: There is abdominal tenderness. There is no right CVA tenderness, left CVA tenderness, guarding or rebound.     Hernia: No hernia is present.  Musculoskeletal:     Cervical back: Neck supple.  Skin:    General: Skin is warm.     Findings: No rash.  Neurological:     Mental Status: She is alert.  Psychiatric:         Behavior: Behavior normal.     ED Results / Procedures / Treatments   Labs (all labs ordered are listed, but only abnormal results are displayed) Labs Reviewed  CBC WITH DIFFERENTIAL/PLATELET - Abnormal; Notable for the following components:      Result Value   RBC 5.20 (*)    All other components within normal limits  COMPREHENSIVE METABOLIC PANEL - Abnormal; Notable for the following components:   CO2 19 (*)    Glucose, Bld 103 (*)    Total Protein 9.0 (*)    Anion gap 20 (*)    All other components within normal limits  POC SARS CORONAVIRUS 2 AG -  ED -  Abnormal; Notable for the following components:   SARS Coronavirus 2 Ag POSITIVE (*)    All other components within normal limits  LIPASE, BLOOD  I-STAT BETA HCG BLOOD, ED (MC, [facility], AP ONLY)  I-STAT BETA HCG BLOOD, ED ([facility], [facility], AP ONLY)  TROPONIN I (HIGH SENSITIVITY)    EKG None  Radiology DG Chest Portable 1 View  Result Date: 08/14/2019 CLINICAL DATA:  34 year old female with COVID-19 exposure. Fever chills cough and vomiting tonight. EXAM: PORTABLE CHEST 1 VIEW COMPARISON:  Portable chest 01/20/2004. FINDINGS: Portable AP upright view at 2303 hours. Lung volumes and mediastinal contours are within normal limits. Incidental nipple shadows. Allowing for portable technique the lungs are clear. No pneumothorax. Visualized tracheal air column is within normal limits. Negative visible bowel gas pattern. No acute osseous abnormality identified. IMPRESSION: Negative portable chest. Electronically Signed   By: Odessa Fleming M.D.   On: 08/14/2019 23:13    Procedures Procedures (including critical care time)  Medications Ordered in ED Medications  sodium chloride 0.9 % bolus 500 mL (0 mLs Intravenous Stopped 08/15/19 0448)  ondansetron (ZOFRAN) injection 4 mg (4 mg Intravenous Given 08/15/19 0229)  metoCLOPramide (REGLAN) injection 10 mg (10 mg Intravenous Given 08/15/19 2979)    ED Course  I have reviewed the triage vital signs  and the nursing notes.  Pertinent labs & imaging results that were available during my care of the patient were reviewed by me and considered in my medical decision making (see chart for details).    MDM Rules/Calculators/A&P                      34 year old female with a history of opioid withdrawal presenting with fever, chills, chest pain, shortness of breath, cough, nausea, vomiting, diarrhea, and myalgias, onset yesterday after having a known COVID-19 exposure on 12/31.   Rapid COVID-19 test was ordered by nursing staff and is positive.  Patient's vital signs have been normal since arrival in the ER.  No hypoxia and she is not febrile.  Pulmonary exam is unremarkable.  The patient was discussed with Dr. Daun Peacock, attending physician.  Shared decision-making conversation with the patient.  Given that she has only had 5 episodes of vomiting and vital signs are normal, discussed administering antiemetic and fluid challenge and the patient, and discharging her home with symptomatic treatment versus checking labs for electrolyte derangements.  She opted for the latter.  She was given IV fluids and Zofran.  Initially failed fluid challenge as she began vomiting.  She was then administered Reglan and was successfully able to drink multiple beverages in the ER without vomiting.   Chest x-ray is unremarkable.  Potassium chloride was normal.  Bicarb is minimally elevated with elevated anion gap.  And CBC appears hemoconcentrated.  We will treat with IV fluids.  UA is not obtained as she is having no urinary complaints.  Troponin is not elevated.  Doubt ACS.  I suspect all of her complaints are related to COVID-19.  We will discharge with symptomatic treatment with albuterol inhaler and Zofran.  ER return precautions given.  She is hemodynamically stable and in no acute distress.  Safe for discharge to home with outpatient follow-up as indicated.  Final Clinical Impression(s) / ED Diagnoses Final  diagnoses:  COVID-19    Rx / DC Orders ED Discharge Orders         Ordered    ondansetron (ZOFRAN ODT) 4 MG disintegrating tablet  Every 8 hours PRN  08/15/19 0557    albuterol (VENTOLIN HFA) 108 (90 Base) MCG/ACT inhaler  Every 4 hours PRN     08/15/19 0557    Spacer/Aero-Holding Chambers (AEROCHAMBER PLUS) inhaler     08/15/19 0557           Barkley Boards, PA-C 08/15/19 0726    Palumbo, April, MD 08/16/19 0028

## 2019-08-15 NOTE — ED Notes (Signed)
Patient verbalizes understanding of discharge instructions. Opportunity for questioning and answers were provided. Armband removed by staff, pt discharged from ED. Pt. ambulatory and discharged home.  

## 2019-08-15 NOTE — ED Notes (Signed)
Pt's pulse ox 97-99% while ambulating.

## 2021-02-28 ENCOUNTER — Ambulatory Visit (HOSPITAL_COMMUNITY): Payer: Medicaid Other | Admitting: Clinical

## 2021-03-26 ENCOUNTER — Ambulatory Visit (HOSPITAL_COMMUNITY)
Admission: RE | Admit: 2021-03-26 | Discharge: 2021-03-26 | Disposition: A | Discharge status: Home / Self Care | Payer: Medicaid Other | Attending: Psychiatry | Admitting: Psychiatry

## 2021-03-26 NOTE — BH Assessment (Signed)
Comprehensive Clinical Assessment (CCA) Note  03/26/2021 [name] [id number]  Disposition: TTS completed by this Clinician and [facility] provider. The [facility] provider determined that patient is psych cleared and does not meet criteria for psychiatric treatment. Patient referred to outpatient treatment at the [facility] for outpatient medication management and therapy. [place]-SUICIDE SEVERITY RATING SCALE completed and patient is "No Risk".  The patient demonstrates the following risk factors for suicide: Chronic risk factors for suicide include: psychiatric disorder of Major Depressive Disorder, Moderate and Anxiety Disorder . Acute risk factors for suicide include:  death of her daughters father 3 years ago...states that she was with him for 10 years and still grieving; legal charges . Protective factors for this patient include: responsibility to others (children, family). Considering these factors, the overall suicide risk at this point appears to be low. Patient is not appropriate for outpatient follow up.   Flowsheet Row [facility]  C-SSRS RISK CATEGORY No Risk        Chief Complaint:  Chief Complaint  Patient presents with   Psychiatric Evaluation   Visit Diagnosis: Major Depressive Disorder, Moderate and Anxiety Disorder  Patient is a 35 y/o female. She presents to [facility] as a walk in with a complaint of depression and anxiety. Referred by her probation officer and [facility] program case manager. States that she is currently on probation for drug possession. However, reports that the drugs found did not belong to her but her deceased boyfriend. States that she ended up getting stuck with the charges. Patient denies history of substance use issues.   Patient denies SI, HI, and AVH's. She does report a lot depressive symptoms stemming from the death of her ex boyfriend, killed 3 years ago. Patient becomes tearful during the assessment  stating that they shared a daughter together. Her daughters birthday is approaching and patient has started feel depressed about her ex boyfriend who will not be present.   Patient calm and cooperative. Speech was normal. Oriented to person, place, time, and situation. Insight and impulse control is good.     CCA Screening, Triage and Referral (STR)  Patient Reported Information How did you hear about [place]? Legal System  What Is the Reason for Your Visit/Call Today? "My probation officer sent me here to referrals for my depression, so I could get conected to a therapist or psychiatrist in the community"  How Long Has This Been Causing You Problems? > than 6 months  What Do You Feel Would Help You the Most Today? Treatment for Depression or other mood problem; Medication(s)   Have You Recently Had Any Thoughts About Hurting Yourself? No  Are You Planning to Commit Suicide/Harm Yourself At This time? No   Have you Recently Had Thoughts About Hurting Someone [name]? No  Are You Planning to Harm Someone at This Time? No  Explanation: No data recorded  Have You Used Any Alcohol or Drugs in the Past 24 Hours? No  How Long Ago Did You Use Drugs or Alcohol? No data recorded What Did You Use and How Much? No data recorded  Do You Currently Have a Therapist/Psychiatrist? No  Name of Therapist/Psychiatrist: No data recorded  Have You Been Recently Discharged From Any Office Practice or Programs? No  Explanation of Discharge From Practice/Program: No data recorded    CCA Screening Triage Referral Assessment Type of Contact: Face-to-Face  Telemedicine Service Delivery:   Is this Initial or Reassessment? No data recorded Date Telepsych  consult ordered in [facility]:  No data recorded Time Telepsych consult ordered in [facility]:  No data recorded Location of Assessment: [facility]  Provider Location: [facility] Assessment Services   Collateral Involvement: No data recorded  Does  Patient Have a Court Appointed Legal Guardian? No data recorded Name and Contact of Legal Guardian: No data recorded If Minor and Not Living with Parent(s), Who has Custody? No data recorded Is CPS involved or ever been involved? Never  Is APS involved or ever been involved? Never   Patient Determined To Be At Risk for Harm To Self or Others Based on Review of Patient Reported Information or Presenting Complaint? No  Method: No data recorded Availability of Means: No data recorded Intent: No data recorded Notification Required: No data recorded Additional Information for Danger to Others Potential: No data recorded Additional Comments for Danger to Others Potential: No data recorded Are There Guns or Other Weapons in Your Home? No data recorded Types of Guns/Weapons: No data recorded Are These Weapons Safely Secured?                            No data recorded Who Could Verify You Are Able To Have These Secured: No data recorded Do You Have any Outstanding Charges, Pending Court Dates, Parole/Probation? No data recorded Contacted To Inform of Risk of Harm To Self or Others: No data recorded   Does Patient Present under Involuntary Commitment? No  IVC Papers Initial File Date: No data recorded  [place] of Residence: [place]   Patient Currently Receiving the Following Services: No data recorded  Determination of Need: Emergent (2 hours)   Options For Referral: Medication Management; Outpatient Therapy     CCA Biopsychosocial Patient Reported Schizophrenia/Schizoaffective Diagnosis in Past: No   Strengths: No data recorded  Mental Health Symptoms Depression:   Difficulty Concentrating; Fatigue; Tearfulness; Change in energy/activity   Duration of Depressive symptoms:  Duration of Depressive Symptoms: Greater than two weeks   Mania:  No data recorded  Anxiety:    None   Psychosis:   None   Duration of Psychotic symptoms:    Trauma:   None   Obsessions:    None   Compulsions:  No data recorded  Inattention:   None   Hyperactivity/Impulsivity:   None   Oppositional/Defiant Behaviors:   None   Emotional Irregularity:   None   Other Mood/Personality Symptoms:  No data recorded   Mental Status Exam Appearance and self-care  Stature:   Average   Weight:   Average weight   Clothing:   Neat/clean   Grooming:   Normal   Cosmetic use:   None   Posture/gait:   Normal   Motor activity:   Not Remarkable   Sensorium  Attention:   Normal   Concentration:   Normal   Orientation:   Time; Situation; Place; Person; Object   Recall/memory:   Normal   Affect and Mood  Affect:   Appropriate   Mood:   Depressed   Relating  Eye contact:   Normal   Facial expression:   Depressed; Sad   Attitude toward examiner:   Cooperative   Thought and Language  Speech flow:  Clear and Coherent   Thought content:  No data recorded  Preoccupation:   None   Hallucinations:   None   Organization:  No data recorded  [employer] of Knowledge:   Average  Intelligence:   Average   Abstraction:   Normal   Judgement:   Good   Reality Testing:   Adequate   Insight:   Good   Decision Making:   Normal   Social Functioning  Social Maturity:  No data recorded  Social Judgement:   Normal   Stress  Stressors:   Armed forces operational officer; Other (Comment) (grief)   Coping Ability:   Normal   Skill Deficits:   None   Supports:   Support needed     Religion: Religion/Spirituality Are You A Religious Person?: No  Leisure/Recreation: Leisure / Recreation Do You Have Hobbies?: No  Exercise/Diet: Exercise/Diet Do You Exercise?: No Have You Gained or Lost A Significant Amount of Weight in the Past Six Months?: No Do You Follow a Special Diet?: No Do You Have Any Trouble Sleeping?: No   CCA Employment/Education Employment/Work Situation: Employment / Work Situation Employment Situation:  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) Work Stressors: unk Patient's Job has Been Impacted by Current Illness: No Has Patient ever Been in Equities trader?: No  Education: Education Is Patient Currently Attending School?: No Last Grade Completed:  (unknown) Did You Product manager?: No Did You Have An Individualized Education Program (IIEP): No Did You Have Any Difficulty At [employer]?: No Patient's Education Has Been Impacted by Current Illness: No   CCA Family/Childhood History Family and Relationship History: Family history Marital status: Single Does patient have children?: Yes How many children?: 1 How is patient's relationship with their children?: patient did not report any isses within his relationship  Childhood History:  Childhood History By whom was/is the patient raised?: Other (Comment) (unknown) Did patient suffer any verbal/emotional/physical/sexual abuse as a child?: No Did patient suffer from severe childhood neglect?: No Has patient ever been sexually abused/assaulted/raped as an adolescent or adult?: No Was the patient ever a victim of a crime or a disaster?: No Witnessed domestic violence?: No Has patient been affected by domestic violence as an adult?: No  Child/Adolescent Assessment:     CCA Substance Use Alcohol/Drug Use: Alcohol / Drug Use Pain Medications: SEE MAR Prescriptions: SEE MAR Over the Counter: SEE MAR History of alcohol / drug use?: No history of alcohol / drug abuse Negative Consequences of Use: Legal                         ASAM's:  Six Dimensions of Multidimensional Assessment  Dimension 1:  Acute Intoxication and/or Withdrawal Potential:      Dimension 2:  Biomedical Conditions and Complications:      Dimension 3:  Emotional, Behavioral, or Cognitive Conditions and Complications:     Dimension 4:  Readiness to Change:     Dimension 5:  Relapse, Continued use, or Continued Problem Potential:     Dimension 6:   Recovery/Living Environment:     ASAM Severity Score:    ASAM Recommended Level of Treatment:     Substance use Disorder (SUD)    Recommendations for Services/Supports/Treatments: Recommendations for Services/Supports/Treatments Recommendations For Services/Supports/Treatments: Intensive In-Home Services, Medication Management, Individual Therapy, IOP (Intensive Outpatient Program)  Discharge Disposition:    DSM5 Diagnoses: Patient Active Problem List   Diagnosis Date Noted   Late prenatal care 03/29/2013   SVD (spontaneous vaginal delivery) 03/29/2013     Referrals to Alternative Service(s): Referred to Alternative Service(s):   Place:   Date:   Time:    Referred to Alternative Service(s):   Place:   Date:  Time:    Referred to Alternative Service(s):   Place:   Date:   Time:    Referred to Alternative Service(s):   Place:   Date:   Time:     Waldon Merl, Counselor
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
## 2021-06-01 ENCOUNTER — Emergency Department (HOSPITAL_BASED_OUTPATIENT_CLINIC_OR_DEPARTMENT_OTHER)
Admission: EM | Admit: 2021-06-01 | Discharge: 2021-06-01 | Disposition: A | Discharge status: Home / Self Care | Payer: Medicaid Other | Attending: Emergency Medicine | Admitting: Emergency Medicine

## 2021-06-01 ENCOUNTER — Encounter (HOSPITAL_BASED_OUTPATIENT_CLINIC_OR_DEPARTMENT_OTHER): Payer: Self-pay | Admitting: Emergency Medicine

## 2021-06-01 ENCOUNTER — Other Ambulatory Visit: Payer: Self-pay

## 2021-06-01 DIAGNOSIS — F17210 Nicotine dependence, cigarettes, uncomplicated: Secondary | ICD-10-CM | POA: Diagnosis not present

## 2021-06-01 DIAGNOSIS — K0889 Other specified disorders of teeth and supporting structures: Secondary | ICD-10-CM

## 2021-06-01 MED ORDER — CLINDAMYCIN HCL 150 MG PO CAPS: 150.0000 mg | ORAL_CAPSULE | Freq: Four times a day (QID) | ORAL | 0 refills | Status: AC

## 2021-06-01 MED ORDER — HYDROCODONE-ACETAMINOPHEN 5-325 MG PO TABS
1.0000 | ORAL_TABLET | Freq: Once | ORAL | Status: AC
  Administered 2021-06-01: 1 via ORAL
  Filled 2021-06-01: qty 1

## 2021-06-01 MED ORDER — NAPROXEN 375 MG PO TABS: 375.0000 mg | ORAL_TABLET | Freq: Two times a day (BID) | ORAL | 0 refills | Status: AC

## 2021-06-01 NOTE — Discharge Instructions (Addendum)
Take clindamycin as discussed to prevent further worsening of an infection.  Follow-up with dentist as discussed.  I have sent in naproxen to help with pain control.  You can take Tylenol in addition to that if you need to for breakthrough pain.  If you develop worsening pain or unable to tolerate food and drink please return to the emergency room.  I have attached information to our on-call dentist clinic for you, as well as dental resource guide.

## 2021-06-01 NOTE — ED Provider Notes (Signed)
[facility] EMERGENCY DEPT Provider Note   CSN: [id number] Arrival date & time: 06/01/21  1615     History Chief Complaint  Patient presents with   Dental Pain    [name] is a 35 y.o. female.  35 year old female presents today for dental pain pain and swelling of 3-day duration.  Patient reports about 6 weeks ago she had a tooth fracture secondary to a cavity that she has not had evaluated by a dentist.  She reports she was doing well until few days ago when the swelling appeared along with pain.  She denies fever, chills, drainage from the area.  She reports she has been able to eat and drink without difficulty.  She denies any shortness of breath.  The history is provided by the patient. No language interpreter was used.  Dental Pain Location:  Upper Quality:  Throbbing Severity:  Moderate Duration:  3 days Timing:  Constant Progression:  Unchanged Chronicity:  New Context: dental fracture   Relieved by:  None tried Worsened by:  Pressure and touching Ineffective treatments:  None tried Associated symptoms: gum swelling   Associated symptoms: no difficulty swallowing, no drooling, no fever and no neck swelling   Risk factors: lack of dental care       Past Medical History:  Diagnosis Date   Headache(784.0)    No pertinent past medical history     Patient Active Problem List   Diagnosis Date Noted   Late prenatal care 03/29/2013   SVD (spontaneous vaginal delivery) 03/29/2013    Past Surgical History:  Procedure Laterality Date   NO PAST SURGERIES     WISDOM TOOTH EXTRACTION       OB History     Gravida  3   Para  3   Term  2   Preterm      AB      Living  2      SAB      IAB      Ectopic      Multiple      Live Births  2           Family History  Problem Relation Age of Onset   Diabetes Mother    Hypertension Mother    Heart disease Paternal Grandfather    Hypertension Paternal Grandfather      Social History   Tobacco Use   Smoking status: Every Day    Types: Cigarettes   Smokeless tobacco: Never  Substance Use Topics   Alcohol use: No   Drug use: No    Home Medications Prior to Admission medications   Medication Sig Start Date End Date Taking? Authorizing Provider  albuterol (VENTOLIN HFA) 108 (90 Base) MCG/ACT inhaler Inhale 2 puffs into the lungs every 4 (four) hours as needed for wheezing or shortness of breath. 08/15/19   McDonald, Mia A, PA-C  doxycycline (VIBRAMYCIN) 100 MG capsule Take 1 capsule (100 mg total) by mouth 2 (two) times daily. 11/27/15   Antony Madura, PA-C  naproxen (NAPROSYN) 500 MG tablet Take 1 tablet (500 mg total) by mouth 2 (two) times daily. 11/27/15   Antony Madura, PA-C  ondansetron (ZOFRAN ODT) 4 MG disintegrating tablet Take 1 tablet (4 mg total) by mouth every 8 (eight) hours as needed. 08/15/19   McDonald, Mia A, PA-C  Spacer/Aero-Holding Chambers (AEROCHAMBER PLUS) inhaler Use as instructed 08/15/19   McDonald, Mia A, PA-C  cloNIDine (CATAPRES) 0.1 MG tablet Take 1 tablet (0.1 mg total)  by mouth daily as needed (withdrawal). Patient not taking: Reported on 07/10/2015 07/09/15 08/15/19  Lyndal Pulley, MD    Allergies    Patient has no known allergies.  Review of Systems   Review of Systems  Constitutional:  Negative for chills and fever.  HENT:  Positive for dental problem. Negative for drooling, sore throat, trouble swallowing and voice change.   Respiratory:  Negative for shortness of breath.    Physical Exam Updated Vital Signs BP 117/88 (BP Location: Left Arm)   Pulse 86   Temp 98.1 F (36.7 C) (Oral)   Resp 18   Ht 5\' 1"  (1.549 m)   Wt 52.2 kg   LMP 05/11/2021   SpO2 98%   BMI 21.73 kg/m   Physical Exam Vitals and nursing note reviewed.  Constitutional:      General: She is not in acute distress.    Appearance: Normal appearance. She is not ill-appearing.  HENT:     Head: Normocephalic and atraumatic.     Nose: Nose  normal.     Mouth/Throat:     Mouth: Mucous membranes are moist.     Dentition: Abnormal dentition. Gingival swelling and dental caries present. No dental abscesses.     Pharynx: Uvula midline. No oropharyngeal exudate, posterior oropharyngeal erythema or uvula swelling.     Tonsils: No tonsillar exudate or tonsillar abscesses.      Comments: Diffuse cavities.  Patient has multiple teeth which are missing.  Localized gum swelling noted above the affected tooth.  No visible drainage from the area. Eyes:     Conjunctiva/sclera: Conjunctivae normal.  Pulmonary:     Effort: Pulmonary effort is normal. No respiratory distress.  Musculoskeletal:        General: No deformity.  Skin:    Findings: No rash.  Neurological:     Mental Status: She is alert.    ED Results / Procedures / Treatments   Labs (all labs ordered are listed, but only abnormal results are displayed) Labs Reviewed - No data to display  EKG None  Radiology No results found.  Procedures Procedures   Medications Ordered in ED Medications - No data to display  ED Course  I have reviewed the triage vital signs and the nursing notes.  Pertinent labs & imaging results that were available during my care of the patient were reviewed by me and considered in my medical decision making (see chart for details).    MDM Rules/Calculators/A&P                           35 year old female presents today for 3-day duration of dental pain and swelling.  She is without signs or symptoms of systemic infection or local abscess.  She has been tolerating p.o. intake without issue.  Will discharge with clindamycin.  Discussed importance of follow-up with a dentist for oral care.  She voices understanding and is in agreement with plan.  Return precautions discussed.  Final Clinical Impression(s) / ED Diagnoses Final diagnoses:  None    Rx / DC Orders ED Discharge Orders     None        32, PA-C 06/01/21 1945     06/03/21, MD 06/02/21 1021

## 2021-06-01 NOTE — ED Triage Notes (Signed)
Pt arrives pov with c/o left side upper dental pain x 3 days. Pt reports broken tooth.Tylenol at 1600 with little relief

## 2021-06-12 ENCOUNTER — Other Ambulatory Visit: Payer: Self-pay

## 2021-06-12 ENCOUNTER — Ambulatory Visit (INDEPENDENT_AMBULATORY_CARE_PROVIDER_SITE_OTHER): Payer: Medicaid Other | Admitting: Clinical

## 2021-06-12 DIAGNOSIS — F4321 Adjustment disorder with depressed mood: Secondary | ICD-10-CM | POA: Diagnosis not present

## 2021-06-12 DIAGNOSIS — F4310 Post-traumatic stress disorder, unspecified: Secondary | ICD-10-CM
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
## 2021-06-13 ENCOUNTER — Ambulatory Visit (HOSPITAL_COMMUNITY): Payer: Medicaid Other | Admitting: Clinical

## 2021-07-24 ENCOUNTER — Ambulatory Visit (INDEPENDENT_AMBULATORY_CARE_PROVIDER_SITE_OTHER): Payer: Medicaid Other | Admitting: Clinical

## 2021-07-24 ENCOUNTER — Other Ambulatory Visit: Payer: Self-pay

## 2021-07-24 DIAGNOSIS — F4321 Adjustment disorder with depressed mood: Secondary | ICD-10-CM

## 2021-07-24 NOTE — Progress Notes (Signed)
° °  THERAPIST PROGRESS NOTE  Session Time: 25 minutes  Participation Level: Active  Behavioral Response: CasualAlertEuthymic  Type of Therapy: Individual Therapy  Treatment Goals addressed: Coping  Interventions: CBT and Supportive  Summary: [name] is a 35 y.o. female who presents for the scheduled session oriented x5, appropriately dressed, and friendly.  Client denied hallucinations and delusions. Client reported today that she has been doing well since she was last seen.  Client reported she continues to work both jobs and things are going well.  Client reported probation has moved her appointments to every 3 months and she has the anticipated end date of April 2023.  Client reported her involvement with [facility] schedule to be complete August 23, 2021.  Client reported she feels like her depression/grief has improved over time.  Client reported she has been progressing and other aspects of her life such as setting employment and financial stability and she feels good about being able to do more than she could for her family last year.  Client reported throughout her grieving process she feels that she has done in her own way without help but feels it was beneficial ultimately.  Client reported when she does have reflective thoughts about her father started she does not know why than and allows it to happen.  Client reported she has a positive aspect that God put her in the situation to be stronger for their daughter.  Client reported moving forward she will be positively reflecting throughout her daughter's milestones as a way to contribute to her daughter's father.  Client reported over time she has the goal to save money to get her car and quit working her second job.    Suicidal/Homicidal: Nowithout intent/plan  Therapist Response:  Therapist began the appointment asking the client how she has been doing since last seen. Therapist used CBT to utilize active listening and positive  emotional support towards her thoughts and feelings. Therapist used CBT to ask the client to identify changes in thinking styles and coping strategies to help her cope with depression and/or grief symptoms. Therapist used CBT to reinforce the clients positive reframing and perspective to helping her cope with uncomfortable emotions. Therapist assigned client homework to consider creating a special ritual with her daughter which helps to honor her father during special times of the year. Client was scheduled for next appointment.    Plan: Return again in 6 weeks.  Diagnosis: Grief  Neena Rhymes Samanvi Cuccia, LCSW 07/24/2021

## 2021-09-21 IMAGING — DX DG CHEST 1V PORT
1 series · 1 of 1 positions shown · non-contrast
Comparison: Portable chest 01/20/2004.

CLINICAL DATA: 33-year-old female with [id number] exposure. Fever
chills cough and vomiting tonight.

EXAM:
PORTABLE CHEST 1 VIEW

[chest]
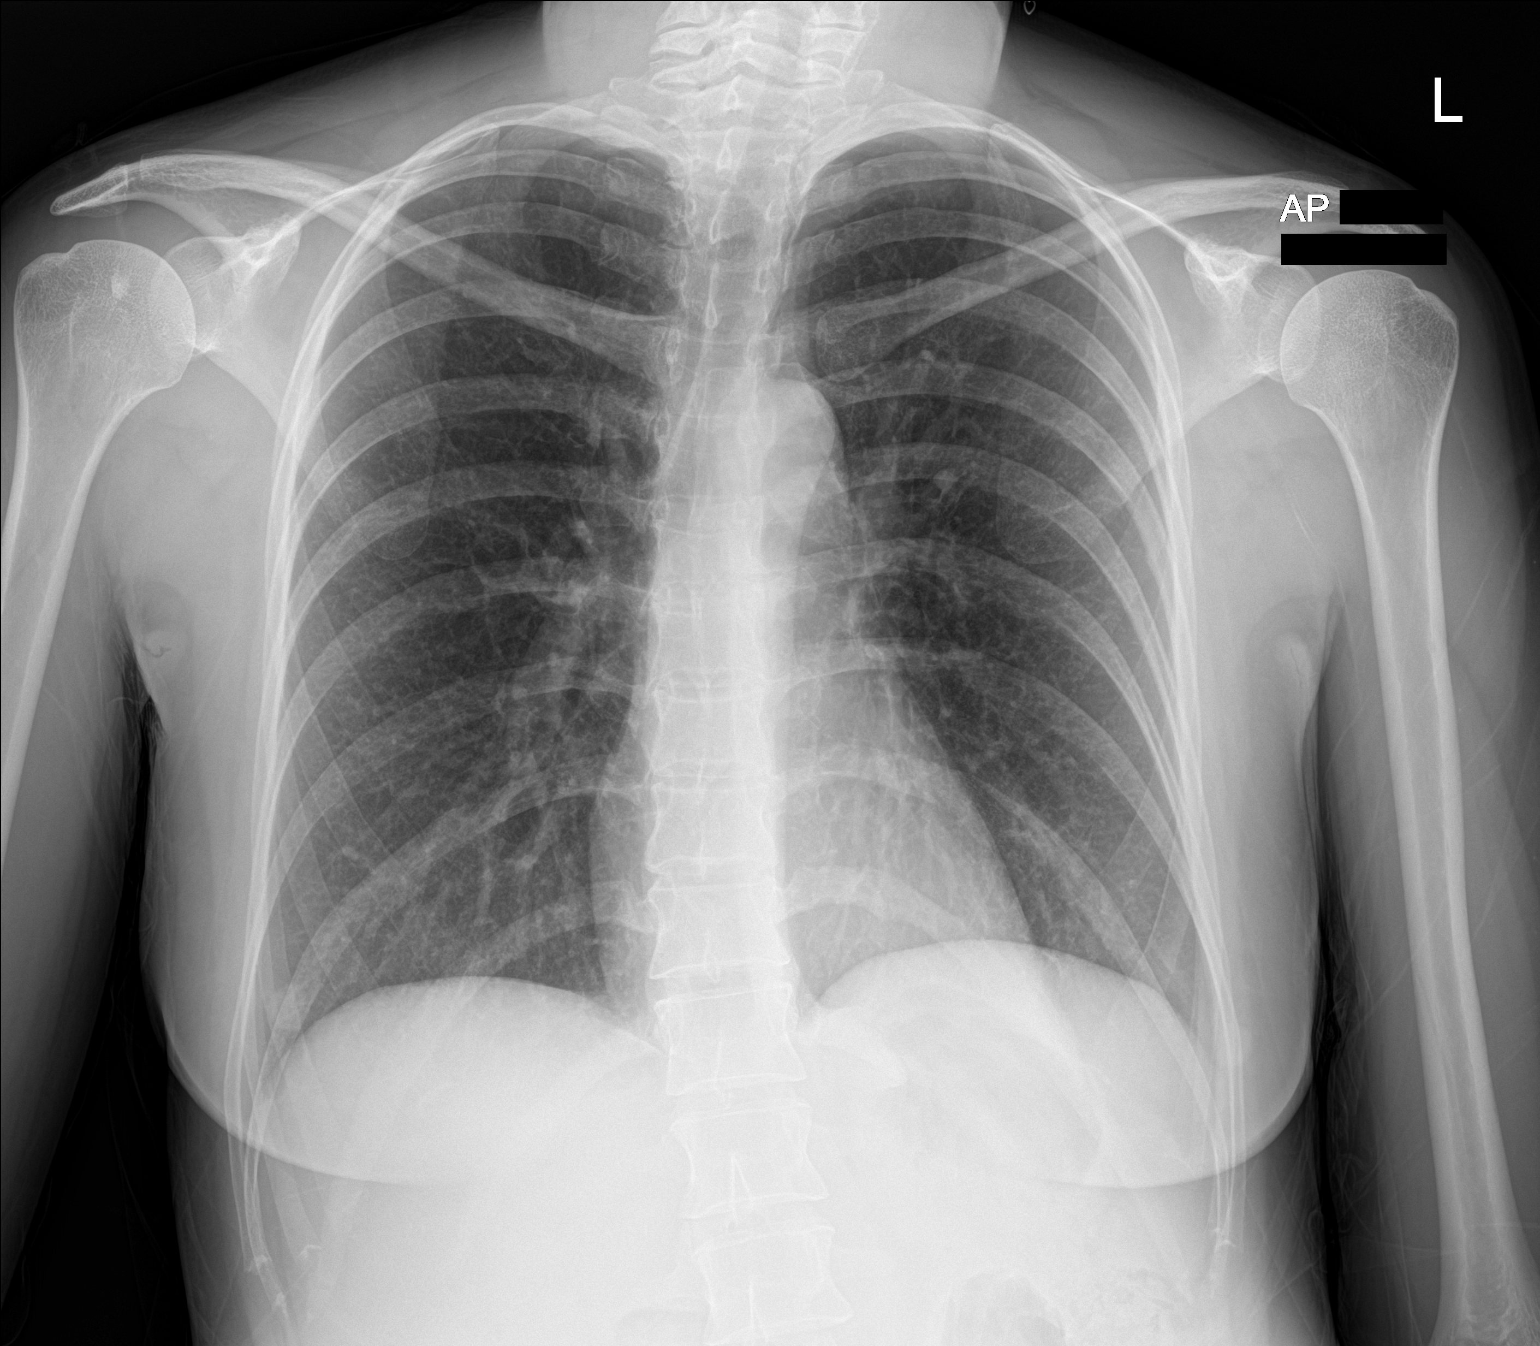

[1 of 1 positions shown; findings below may reference images not displayed]

FINDINGS: Portable AP upright view at 3383 hours. Lung volumes and mediastinal
contours are within normal limits. Incidental nipple shadows.
Allowing for portable technique the lungs are clear. No
pneumothorax. Visualized tracheal air column is within normal
limits. Negative visible bowel gas pattern. No acute osseous
abnormality identified.
IMPRESSION: Negative portable chest.

## 2021-09-26 ENCOUNTER — Encounter (HOSPITAL_COMMUNITY): Payer: Self-pay

## 2021-09-26 ENCOUNTER — Ambulatory Visit (HOSPITAL_COMMUNITY): Payer: Medicaid Other | Admitting: Clinical

## 2021-10-24 ENCOUNTER — Ambulatory Visit (HOSPITAL_COMMUNITY): Payer: Medicaid Other | Admitting: Clinical
